# Patient Record
Sex: Female | Born: 1935 | Race: White | Hispanic: No | Marital: Single | Smoking: Former smoker
Health system: Southern US, Community
[De-identification: ages and names within clinical notes are randomized; demographics above are authoritative.]

## PROBLEM LIST (undated history)

## (undated) DIAGNOSIS — F0390 Unspecified dementia without behavioral disturbance: Secondary | ICD-10-CM

## (undated) DIAGNOSIS — N289 Disorder of kidney and ureter, unspecified: Secondary | ICD-10-CM

## (undated) DIAGNOSIS — E785 Hyperlipidemia, unspecified: Secondary | ICD-10-CM

## (undated) DIAGNOSIS — E079 Disorder of thyroid, unspecified: Secondary | ICD-10-CM

## (undated) DIAGNOSIS — I219 Acute myocardial infarction, unspecified: Secondary | ICD-10-CM

## (undated) DIAGNOSIS — I10 Essential (primary) hypertension: Secondary | ICD-10-CM

## (undated) HISTORY — PX: OTHER SURGICAL HISTORY: SHX169

---

## 2013-10-03 ENCOUNTER — Emergency Department: Payer: Self-pay | Admitting: Emergency Medicine

## 2013-10-03 LAB — BASIC METABOLIC PANEL
Anion Gap: 5 — ABNORMAL LOW (ref 7–16)
BUN: 26 mg/dL — ABNORMAL HIGH (ref 7–18)
CALCIUM: 8.3 mg/dL — AB (ref 8.5–10.1)
CO2: 27 mmol/L (ref 21–32)
Chloride: 108 mmol/L — ABNORMAL HIGH (ref 98–107)
Creatinine: 1.26 mg/dL (ref 0.60–1.30)
EGFR (African American): 46 — ABNORMAL LOW
EGFR (Non-African Amer.): 40 — ABNORMAL LOW
Glucose: 96 mg/dL (ref 65–99)
Osmolality: 284 (ref 275–301)
Potassium: 4.6 mmol/L (ref 3.5–5.1)
SODIUM: 140 mmol/L (ref 136–145)

## 2013-10-03 LAB — URINALYSIS, COMPLETE
Bacteria: NONE SEEN
Bilirubin,UR: NEGATIVE
Blood: NEGATIVE
GLUCOSE, UR: NEGATIVE mg/dL (ref 0–75)
KETONE: NEGATIVE
Nitrite: NEGATIVE
PROTEIN: NEGATIVE
Ph: 6 (ref 4.5–8.0)
RBC,UR: 4 /HPF (ref 0–5)
Specific Gravity: 1.026 (ref 1.003–1.030)
Squamous Epithelial: <1
WBC UR: 146 /HPF (ref 0–5)

## 2013-10-03 LAB — CBC
HCT: 36.6 % (ref 35.0–47.0)
HGB: 12.1 g/dL (ref 12.0–16.0)
MCH: 32.9 pg (ref 26.0–34.0)
MCHC: 33 g/dL (ref 32.0–36.0)
MCV: 100 fL (ref 80–100)
Platelet: 218 10*3/uL (ref 150–440)
RBC: 3.68 10*6/uL — AB (ref 3.80–5.20)
RDW: 13.6 % (ref 11.5–14.5)
WBC: 8.3 10*3/uL (ref 3.6–11.0)

## 2013-10-21 ENCOUNTER — Emergency Department: Payer: Self-pay | Admitting: Emergency Medicine

## 2013-10-28 ENCOUNTER — Emergency Department: Payer: Self-pay | Admitting: Emergency Medicine

## 2013-10-28 LAB — COMPREHENSIVE METABOLIC PANEL
ALK PHOS: 59 U/L
ALT: 17 U/L (ref 12–78)
ANION GAP: 7 (ref 7–16)
AST: 32 U/L (ref 15–37)
Albumin: 2.7 g/dL — ABNORMAL LOW (ref 3.4–5.0)
BUN: 21 mg/dL — AB (ref 7–18)
Bilirubin,Total: 0.4 mg/dL (ref 0.2–1.0)
CALCIUM: 9 mg/dL (ref 8.5–10.1)
CHLORIDE: 104 mmol/L (ref 98–107)
CREATININE: 1.04 mg/dL (ref 0.60–1.30)
Co2: 26 mmol/L (ref 21–32)
EGFR (African American): 58 — ABNORMAL LOW
GFR CALC NON AF AMER: 50 — AB
Glucose: 90 mg/dL (ref 65–99)
Osmolality: 276 (ref 275–301)
POTASSIUM: 4.3 mmol/L (ref 3.5–5.1)
Sodium: 137 mmol/L (ref 136–145)
Total Protein: 6.6 g/dL (ref 6.4–8.2)

## 2013-10-28 LAB — CK TOTAL AND CKMB (NOT AT ARMC)
CK, TOTAL: 104 U/L
CK-MB: 3.2 ng/mL (ref 0.5–3.6)

## 2013-10-28 LAB — CBC
HCT: 36 % (ref 35.0–47.0)
HGB: 11.8 g/dL — ABNORMAL LOW (ref 12.0–16.0)
MCH: 31.8 pg (ref 26.0–34.0)
MCHC: 32.7 g/dL (ref 32.0–36.0)
MCV: 97 fL (ref 80–100)
Platelet: 197 10*3/uL (ref 150–440)
RBC: 3.71 10*6/uL — ABNORMAL LOW (ref 3.80–5.20)
RDW: 13.1 % (ref 11.5–14.5)
WBC: 5.8 10*3/uL (ref 3.6–11.0)

## 2013-10-28 LAB — TROPONIN I: TROPONIN-I: 0.03 ng/mL

## 2013-10-28 LAB — PRO B NATRIURETIC PEPTIDE: B-Type Natriuretic Peptide: 1506 pg/mL — ABNORMAL HIGH (ref 0–450)

## 2013-12-21 ENCOUNTER — Emergency Department: Payer: Self-pay | Admitting: Emergency Medicine

## 2013-12-21 ENCOUNTER — Ambulatory Visit: Payer: Self-pay | Admitting: Internal Medicine

## 2014-01-02 ENCOUNTER — Inpatient Hospital Stay: Payer: Self-pay | Admitting: Internal Medicine

## 2014-01-02 LAB — URINALYSIS, COMPLETE
BILIRUBIN, UR: NEGATIVE
Glucose,UR: NEGATIVE mg/dL (ref 0–75)
Ketone: NEGATIVE
Leukocyte Esterase: NEGATIVE
Nitrite: NEGATIVE
PH: 6 (ref 4.5–8.0)
RBC,UR: 250 /HPF (ref 0–5)
SPECIFIC GRAVITY: 1.014 (ref 1.003–1.030)
Squamous Epithelial: NONE SEEN

## 2014-01-02 LAB — CBC
HCT: 34.3 % — ABNORMAL LOW (ref 35.0–47.0)
HGB: 11.5 g/dL — ABNORMAL LOW (ref 12.0–16.0)
MCH: 31.9 pg (ref 26.0–34.0)
MCHC: 33.5 g/dL (ref 32.0–36.0)
MCV: 95 fL (ref 80–100)
PLATELETS: 208 10*3/uL (ref 150–440)
RBC: 3.6 10*6/uL — AB (ref 3.80–5.20)
RDW: 13.5 % (ref 11.5–14.5)
WBC: 8.9 10*3/uL (ref 3.6–11.0)

## 2014-01-02 LAB — BASIC METABOLIC PANEL
Anion Gap: 6 — ABNORMAL LOW (ref 7–16)
BUN: 53 mg/dL — ABNORMAL HIGH (ref 7–18)
Calcium, Total: 9.6 mg/dL (ref 8.5–10.1)
Chloride: 103 mmol/L (ref 98–107)
Co2: 30 mmol/L (ref 21–32)
Creatinine: 2.4 mg/dL — ABNORMAL HIGH (ref 0.60–1.30)
GFR CALC AF AMER: 21 — AB
GFR CALC NON AF AMER: 18 — AB
GLUCOSE: 94 mg/dL (ref 65–99)
Osmolality: 292 (ref 275–301)
Potassium: 4.1 mmol/L (ref 3.5–5.1)
Sodium: 139 mmol/L (ref 136–145)

## 2014-01-03 LAB — BASIC METABOLIC PANEL
ANION GAP: 10 (ref 7–16)
BUN: 40 mg/dL — AB (ref 7–18)
CALCIUM: 8.4 mg/dL — AB (ref 8.5–10.1)
CREATININE: 2.02 mg/dL — AB (ref 0.60–1.30)
Chloride: 107 mmol/L (ref 98–107)
Co2: 25 mmol/L (ref 21–32)
EGFR (African American): 26 — ABNORMAL LOW
EGFR (Non-African Amer.): 23 — ABNORMAL LOW
Glucose: 82 mg/dL (ref 65–99)
Osmolality: 292 (ref 275–301)
POTASSIUM: 3.4 mmol/L — AB (ref 3.5–5.1)
SODIUM: 142 mmol/L (ref 136–145)

## 2014-01-03 LAB — CBC WITH DIFFERENTIAL/PLATELET
BASOS PCT: 0.7 %
Basophil #: 0.1 10*3/uL (ref 0.0–0.1)
Eosinophil #: 0.1 10*3/uL (ref 0.0–0.7)
Eosinophil %: 1.6 %
HCT: 33.9 % — ABNORMAL LOW (ref 35.0–47.0)
HGB: 11.4 g/dL — ABNORMAL LOW (ref 12.0–16.0)
LYMPHS ABS: 0.9 10*3/uL — AB (ref 1.0–3.6)
LYMPHS PCT: 9.2 %
MCH: 31.7 pg (ref 26.0–34.0)
MCHC: 33.6 g/dL (ref 32.0–36.0)
MCV: 95 fL (ref 80–100)
MONO ABS: 0.7 x10 3/mm (ref 0.2–0.9)
Monocyte %: 6.9 %
NEUTROS ABS: 7.7 10*3/uL — AB (ref 1.4–6.5)
Neutrophil %: 81.6 %
Platelet: 213 10*3/uL (ref 150–440)
RBC: 3.59 10*6/uL — ABNORMAL LOW (ref 3.80–5.20)
RDW: 13.7 % (ref 11.5–14.5)
WBC: 9.4 10*3/uL (ref 3.6–11.0)

## 2014-01-03 LAB — TSH: THYROID STIMULATING HORM: 8.46 u[IU]/mL — AB

## 2014-01-04 LAB — BASIC METABOLIC PANEL
Anion Gap: 11 (ref 7–16)
BUN: 37 mg/dL — AB (ref 7–18)
CO2: 21 mmol/L (ref 21–32)
Calcium, Total: 7.9 mg/dL — ABNORMAL LOW (ref 8.5–10.1)
Chloride: 111 mmol/L — ABNORMAL HIGH (ref 98–107)
Creatinine: 1.83 mg/dL — ABNORMAL HIGH (ref 0.60–1.30)
EGFR (Non-African Amer.): 25 — ABNORMAL LOW
GFR CALC AF AMER: 29 — AB
GLUCOSE: 82 mg/dL (ref 65–99)
Osmolality: 293 (ref 275–301)
Potassium: 3.9 mmol/L (ref 3.5–5.1)
SODIUM: 143 mmol/L (ref 136–145)

## 2014-01-05 LAB — BASIC METABOLIC PANEL
Anion Gap: 11 (ref 7–16)
BUN: 32 mg/dL — ABNORMAL HIGH (ref 7–18)
CHLORIDE: 115 mmol/L — AB (ref 98–107)
CREATININE: 1.72 mg/dL — AB (ref 0.60–1.30)
Calcium, Total: 7.3 mg/dL — ABNORMAL LOW (ref 8.5–10.1)
Co2: 20 mmol/L — ABNORMAL LOW (ref 21–32)
EGFR (African American): 32 — ABNORMAL LOW
EGFR (Non-African Amer.): 27 — ABNORMAL LOW
GLUCOSE: 89 mg/dL (ref 65–99)
OSMOLALITY: 297 (ref 275–301)
POTASSIUM: 3.5 mmol/L (ref 3.5–5.1)
Sodium: 146 mmol/L — ABNORMAL HIGH (ref 136–145)

## 2014-01-12 ENCOUNTER — Inpatient Hospital Stay: Payer: Self-pay | Admitting: Internal Medicine

## 2014-01-12 LAB — COMPREHENSIVE METABOLIC PANEL
ALBUMIN: 2.6 g/dL — AB (ref 3.4–5.0)
ALK PHOS: 67 U/L
ALT: 27 U/L
ANION GAP: 8 (ref 7–16)
BUN: 39 mg/dL — AB (ref 7–18)
Bilirubin,Total: 0.3 mg/dL (ref 0.2–1.0)
Calcium, Total: 8.6 mg/dL (ref 8.5–10.1)
Chloride: 107 mmol/L (ref 98–107)
Co2: 24 mmol/L (ref 21–32)
Creatinine: 2.05 mg/dL — ABNORMAL HIGH (ref 0.60–1.30)
GLUCOSE: 92 mg/dL (ref 65–99)
Osmolality: 287 (ref 275–301)
POTASSIUM: 4.1 mmol/L (ref 3.5–5.1)
SGOT(AST): 51 U/L — ABNORMAL HIGH (ref 15–37)
SODIUM: 139 mmol/L (ref 136–145)
Total Protein: 6.6 g/dL (ref 6.4–8.2)

## 2014-01-12 LAB — CBC
HCT: 32.9 % — ABNORMAL LOW (ref 35.0–47.0)
HGB: 11 g/dL — ABNORMAL LOW (ref 12.0–16.0)
MCH: 31.5 pg (ref 26.0–34.0)
MCHC: 33.3 g/dL (ref 32.0–36.0)
MCV: 95 fL (ref 80–100)
Platelet: 226 10*3/uL (ref 150–440)
RBC: 3.48 10*6/uL — ABNORMAL LOW (ref 3.80–5.20)
RDW: 14 % (ref 11.5–14.5)
WBC: 9.6 10*3/uL (ref 3.6–11.0)

## 2014-01-12 LAB — URINALYSIS, COMPLETE
Bilirubin,UR: NEGATIVE
GLUCOSE, UR: NEGATIVE mg/dL (ref 0–75)
Ketone: NEGATIVE
Nitrite: NEGATIVE
PH: 6 (ref 4.5–8.0)
Specific Gravity: 1.01 (ref 1.003–1.030)
Squamous Epithelial: 3

## 2014-01-12 LAB — TROPONIN I: TROPONIN-I: 0.02 ng/mL

## 2014-01-12 LAB — CK TOTAL AND CKMB (NOT AT ARMC)
CK, TOTAL: 427 U/L — AB
CK-MB: 8.5 ng/mL — AB (ref 0.5–3.6)

## 2014-01-12 LAB — PRO B NATRIURETIC PEPTIDE: B-TYPE NATIURETIC PEPTID: 4499 pg/mL — AB (ref 0–450)

## 2014-01-13 LAB — BASIC METABOLIC PANEL
Anion Gap: 9 (ref 7–16)
BUN: 38 mg/dL — ABNORMAL HIGH (ref 7–18)
CO2: 24 mmol/L (ref 21–32)
CREATININE: 2.01 mg/dL — AB (ref 0.60–1.30)
Calcium, Total: 8.2 mg/dL — ABNORMAL LOW (ref 8.5–10.1)
Chloride: 109 mmol/L — ABNORMAL HIGH (ref 98–107)
EGFR (African American): 31 — ABNORMAL LOW
EGFR (Non-African Amer.): 25 — ABNORMAL LOW
Glucose: 99 mg/dL (ref 65–99)
Osmolality: 292 (ref 275–301)
POTASSIUM: 3.5 mmol/L (ref 3.5–5.1)
Sodium: 142 mmol/L (ref 136–145)

## 2014-01-13 LAB — CBC WITH DIFFERENTIAL/PLATELET
BASOS ABS: 0.1 10*3/uL (ref 0.0–0.1)
Basophil %: 0.5 %
Eosinophil #: 0.2 10*3/uL (ref 0.0–0.7)
Eosinophil %: 1.5 %
HCT: 30.3 % — ABNORMAL LOW (ref 35.0–47.0)
HGB: 10.3 g/dL — ABNORMAL LOW (ref 12.0–16.0)
LYMPHS PCT: 9.8 %
Lymphocyte #: 1 10*3/uL (ref 1.0–3.6)
MCH: 31.8 pg (ref 26.0–34.0)
MCHC: 33.8 g/dL (ref 32.0–36.0)
MCV: 94 fL (ref 80–100)
Monocyte #: 0.8 x10 3/mm (ref 0.2–0.9)
Monocyte %: 7.6 %
NEUTROS PCT: 80.6 %
Neutrophil #: 8.2 10*3/uL — ABNORMAL HIGH (ref 1.4–6.5)
Platelet: 223 10*3/uL (ref 150–440)
RBC: 3.22 10*6/uL — ABNORMAL LOW (ref 3.80–5.20)
RDW: 14 % (ref 11.5–14.5)
WBC: 10.2 10*3/uL (ref 3.6–11.0)

## 2014-01-13 LAB — CK: CK, Total: 272 U/L — ABNORMAL HIGH

## 2014-01-14 LAB — CBC WITH DIFFERENTIAL/PLATELET
BASOS ABS: 0 10*3/uL (ref 0.0–0.1)
BASOS PCT: 0.5 %
Eosinophil #: 0.1 10*3/uL (ref 0.0–0.7)
Eosinophil %: 1.8 %
HCT: 28.5 % — ABNORMAL LOW (ref 35.0–47.0)
HGB: 9.6 g/dL — ABNORMAL LOW (ref 12.0–16.0)
LYMPHS ABS: 0.8 10*3/uL — AB (ref 1.0–3.6)
Lymphocyte %: 11.1 %
MCH: 31.8 pg (ref 26.0–34.0)
MCHC: 33.7 g/dL (ref 32.0–36.0)
MCV: 94 fL (ref 80–100)
MONO ABS: 0.5 x10 3/mm (ref 0.2–0.9)
MONOS PCT: 6.6 %
Neutrophil #: 5.9 10*3/uL (ref 1.4–6.5)
Neutrophil %: 80 %
Platelet: 194 10*3/uL (ref 150–440)
RBC: 3.03 10*6/uL — ABNORMAL LOW (ref 3.80–5.20)
RDW: 14.1 % (ref 11.5–14.5)
WBC: 7.4 10*3/uL (ref 3.6–11.0)

## 2014-01-14 LAB — BASIC METABOLIC PANEL
Anion Gap: 7 (ref 7–16)
BUN: 32 mg/dL — ABNORMAL HIGH (ref 7–18)
CHLORIDE: 113 mmol/L — AB (ref 98–107)
Calcium, Total: 7.5 mg/dL — ABNORMAL LOW (ref 8.5–10.1)
Co2: 25 mmol/L (ref 21–32)
Creatinine: 1.62 mg/dL — ABNORMAL HIGH (ref 0.60–1.30)
GFR CALC AF AMER: 39 — AB
GFR CALC NON AF AMER: 32 — AB
Glucose: 96 mg/dL (ref 65–99)
OSMOLALITY: 295 (ref 275–301)
POTASSIUM: 3.4 mmol/L — AB (ref 3.5–5.1)
Sodium: 145 mmol/L (ref 136–145)

## 2014-01-15 LAB — URINE CULTURE

## 2014-01-16 LAB — BASIC METABOLIC PANEL
Anion Gap: 8 (ref 7–16)
BUN: 25 mg/dL — AB (ref 7–18)
CALCIUM: 7.2 mg/dL — AB (ref 8.5–10.1)
CO2: 23 mmol/L (ref 21–32)
Chloride: 113 mmol/L — ABNORMAL HIGH (ref 98–107)
Creatinine: 1.55 mg/dL — ABNORMAL HIGH (ref 0.60–1.30)
GFR CALC AF AMER: 41 — AB
GFR CALC NON AF AMER: 34 — AB
Glucose: 83 mg/dL (ref 65–99)
OSMOLALITY: 291 (ref 275–301)
Potassium: 3.4 mmol/L — ABNORMAL LOW (ref 3.5–5.1)
Sodium: 144 mmol/L (ref 136–145)

## 2014-01-17 LAB — POTASSIUM: Potassium: 3.9 mmol/L (ref 3.5–5.1)

## 2014-01-20 ENCOUNTER — Ambulatory Visit: Payer: Self-pay | Admitting: Internal Medicine

## 2014-04-22 ENCOUNTER — Ambulatory Visit
Admit: 2014-04-22 | Disposition: A | Discharge status: Home / Self Care | Payer: Self-pay | Attending: Nurse Practitioner | Admitting: Nurse Practitioner

## 2014-07-02 ENCOUNTER — Emergency Department: Payer: Self-pay | Admitting: Emergency Medicine

## 2014-08-13 NOTE — Consult Note (Signed)
PATIENT NAME:  Ebony Harper, Ebony MR#:  [id number] DATE OF BIRTH:  Jun 12, 1930  DATE OF CONSULTATION:  01/14/2014  REFERRING PHYSICIAN:   CONSULTING PHYSICIAN:  Laurier NancyShaukat A. Gualberto Wahlen, MD  INDICATION FOR CONSULTATION: Evaluation for pacemaker with prolonged pause on monitor with syncopal episode.   HISTORY OF PRESENT ILLNESS: This is an 81 year old white female with a past medical history of hyperlipidemia, hypothyroidism, dementia, and hypertension who presented to the Emergency Room with apparently TIA type of symptoms. She had an episode where she got confused and kind of passed out. I was asked to evaluate the patient because on the monitor she showed 16 second pause.  PAST MEDICAL HISTORY: Alzheimer's, hypertension, hypothyroidism, hyperlipidemia, depression, UTI and fall.  SOCIAL HISTORY: Unremarkable.   FAMILY HISTORY: Unremarkable.   HOME MEDICATIONS: Norvasc and clonidine.   PHYSICAL EXAMINATION: GENERAL: She is right now sleeping. Apparently was confused earlier. VITAL SIGNS: Her blood pressure is 148/76, respirations 18, pulse right now is 59, and saturation is 92.  NECK: No JVD.  LUNGS: Good air entry. No rales or rhonchi.  HEART: Regular rate and rhythm. Normal S1, S2. No audible murmur.  ABDOMEN: Soft, nontender. Positive bowel sounds.  EXTREMITIES: No pedal edema.   DIAGNOSTIC DATA: EKG shows sinus rhythm, 63 beats per minute, left axis deviation with low voltage, nonspecific ST-T changes.   CT scan was showing no intracranial hemorrhage or trauma.   Labs show her BUN and creatinine were elevated and 42, but now creatinine is 1.62 and BUN is 32.   Her monitor showed 16 second pause with almost basically asystole for 16.6 seconds followed by 3rd degree AV block. The strips are in the chart.   ASSESSMENT AND PLAN: The patient has syncopal episode with asystole for 16.6 seconds followed by 3rd degree AV block with AV dissociation. Discussed putting temporary pacemaker followed by  permanent pacemaker. Order was placed, however, the power of attorney says that she had indicated to her before that she does not want a permanent pacemaker, thus she would not agree to signing a consent, and she also has been a DNR in the past, thus advise observing the patient, hold off on Norvasc and clonidine. Will control the blood pressure with hydralazine, which does not lower the heart rate. Continue IV fluids and also will get an echocardiogram to look at her ejection fraction. Thank you very much for the referral.   ____________________________ Laurier NancyShaukat A. Ebony Rickel, MD sak:sb D: 01/14/2014 08:21:04 ET T: 01/14/2014 08:35:33 ET JOB#: [id number]  cc: Laurier NancyShaukat A. Lyrique Hakim, MD, <Dictator> Laurier NancySHAUKAT A Analeigha Nauman MD ELECTRONICALLY SIGNED 01/27/2014 9:56

## 2014-08-13 NOTE — H&P (Signed)
PATIENT NAME:  Ebony Harper, Kandiss MR#:  [id number] DATE OF BIRTH:  1931-02-10  DATE OF ADMISSION:  01/02/2014  PRIMARY CARE PROVIDER: [facility].  EMERGENCY DEPARTMENT REFERRING DOCTOR: Darien Ramusavid W. Kaminski, MD   CHIEF COMPLAINT: Patient found on the floor by her family members.   HISTORY OF PRESENT ILLNESS: The patient is an 81 year old white female with dementia who currently resides in an Alzheimer's memory unit who was found on the floor by her family member, awake but not able to get up. She was very weak. The patient was brought to the ED and was noted to have a renal function that appears to be getting worse, with her creatinine being 2.40. The patient appears very dehydrated, as well. She is not drinking much fluid but has been eating okay, according to her family members that are at the bedside. She has not had any nausea, vomiting, or diarrhea; has not had any chest pain or shortness of breath. No fevers, chills. The patient otherwise is unable to provide me any history due to her dementia.   PAST MEDICAL HISTORY: 1.  History of dementia.  2.  History of hypertension.  3.  Hypothyroidism.  4.  History of hyperlipidemia.   PAST SURGICAL HISTORY: None.   ALLERGIES: None.   MEDICATIONS: Tylenol 1000 mg 3 times a day as needed; alendronate 70 mg once weekly on Sunday; aspirin 81, 1 tab p.o. q. daily; atorvastatin 40 at bedtime; calcium plus vitamin D 1 tab p.o. b.i.d.; iron sulfate 325, 1 tab p.o. q. daily; Lasix 20, 1 tab p.o. q. daily; levothyroxine 125 mcg q. daily; ramipril 2.5 q. daily; risperidone 1 mg p.o. t.i.d.; sertraline 25, 1 tab p.o. q. daily; tramadol 50 mg p.o. t.i.d. p.r.n.; Nitrostat 0.4 sublingual p.r.n.   SOCIAL HISTORY: Does not smoke. Does not drink. No drugs.   FAMILY HISTORY: Positive for hypertension.  REVIEW OF SYSTEMS: Unobtainable due to patient's dementia.   PHYSICAL EXAMINATION:  VITAL SIGNS: Temperature 97.7, pulse 57, respirations 18, blood  pressure 188/70.  GENERAL: The patient is a well-developed female, appears very dehydrated.  HEENT: Head atraumatic, normocephalic. Pupils equally round, reactive to light and accommodation. There is no conjunctival pallor. No scleral icterus. Extraocular movements intact. Nasal exam shows no drainage or ulceration.  OROPHARYNX: Very dry without any exudate.  NECK: Supple without any JVD. No thyromegaly.  CARDIOVASCULAR: Regular rate and rhythm. No murmurs, rubs, clicks, or gallops.  LUNGS: Clear to auscultation bilaterally without any rales, rhonchi, wheezing.  ABDOMEN: Soft, nontender, nondistended. Positive bowel sounds x 4. No hepatosplenomegaly.  EXTREMITIES: No clubbing, cyanosis, or edema.  SKIN: No rash.  LYMPHATICS: No lymph nodes palpable.  VASCULAR: Good DP, PT pulses.  NEUROLOGICAL: The patient is awake, not oriented to time or place. Cranial nerves II through XII grossly intact.  PSYCHIATRIC: Currently not anxious.  EVALUATIONS: Glucose 94, BUN 53, creatinine 2.40, sodium 139, potassium 4.1, chloride 103, CO2 of 30, calcium 9.6. WBC 8.9, hemoglobin 11.5, platelet count 208,000.   ASSESSMENT AND PLAN: The patient is an 81 year old white female with a history of dementia, hypertension, hyperlipidemia, hypothyroidism, who was found on the floor in the bathroom, noted to have acute renal failure.  1.  Fall, possibly due to dehydration. At this time, we will give her intravenous fluids, follow renal function, stop Lasix. We will obtain physical therapy, occupational therapy evaluation and treatment and try to ambulate the patient.  2.  Hypertension. We will hold ramipril due to acute renal failure, use hydralazine  p.r.n.  3.  Hyperlipidemia. Continue atorvastatin.  4.  Dementia with behavioral disturbances. Continue risperidone and sertraline as taking at home.  5.  Miscellaneous. The patient will be on heparin for deep vein thrombosis prophylaxis.  TIME SPENT ON THIS PATIENT: 50  minutes.   ____________________________ Lacie Scotts Allena Katz, MD shp:ST D: 01/02/2014 21:05:02 ET T: 01/02/2014 22:02:59 ET JOB#: [id number]  cc: Payeton Germani H. Allena Katz, MD, <Dictator> Charise Carwin MD ELECTRONICALLY SIGNED 01/07/2014 8:41

## 2014-08-13 NOTE — Consult Note (Signed)
PATIENT NAME:  [name], [name] MR#:  [id number] DATE OF BIRTH:  11-Oct-1930  DATE OF CONSULTATION:  01/16/2014  CONSULTING PHYSICIAN:  Titianna Loomis K. Jae Skeet, MD  SUBJECTIVE:  The patient was seen in consultation in [facility] number [facility].  The patient is an 81 year old white female who is a poor historian.  According to the information obtained from the staff, the patient has long history of Alzheimer's dementia and has [facility].  The patient was admitted for UTI and became combative and aggressive in her behavior.  Staff reports that they cannot redirect the patient who is very agitated and she is hallucinating and seeing elephants around and not aware of her surroundings.    MENTAL STATUS EXAMINATION:  The patient was seen lying in bed.  She was sleepy, but arousable and she became alert and she knew her full name.  She did not know where she was and she did not what the day was, what the time was and what the place was and in fact when questions were asked she replied stating "you are playing games."  She cannot identify any of the objects around her.  She could not identify a pen.  She could not identify a piece of paper.  She could not identify a television.  She was completely agitated and irritable.  She starting hoarding the hand of the staff nurse and would not leave it and became "very angry and aggressive."  Insight and judgment impaired.  Impulse control is poor.  Cognition is impaired.  Is psychotic and probably responding to stimuli and with acting out, behavior which is not redirectable.    IMPRESSION:  Alzheimer dementia, severe.  RECOMMENDATIONS:  Start the patient on Haldol 1 mg p.o. 3 times a day, Ativan 1 mg p.o. twice a day.  Ativan 1 mg p.o. or intramuscular every 4 hours p.r.n. for agitation, Haldol 1 mg p.o. or intramuscular every 4 hours p.r.n. for agitation and can monitor for side effects, Aricept 5 mg p.o. daily, Namenda 10 mg p.o. daily.  If the patient is treated  for urinary tract infection and is stabilized medically, she can be [facility] as it is unlikely she is going to get much with her dementia and with her cognition, she has to be treated for her symptoms as stated above, until she calms down and is redirectable and this can be done at memory care unit.      ____________________________ Ebony MantisSurya K. Guss Bundehalla, MD skc:DT D: 01/16/2014 10:34:00 ET T: 01/16/2014 14:06:18 ET JOB#: [id number]  cc: Monika SalkSurya K. Guss Bundehalla, MD, <Dictator> Beau FannySURYA K Nichol Ator MD ELECTRONICALLY SIGNED 02/04/2014 14:23

## 2014-08-13 NOTE — H&P (Signed)
PATIENT NAME:  Ebony Harper, Ebony Harper MR#:  [id number] DATE OF BIRTH:  1930/06/09  DATE OF ADMISSION:  01/12/2014  PRIMARY CARE PROVIDER: [facility].    REFERRING DOCTOR:   Dr. Fanny BienQuale.     CHIEF COMPLAINT: Fall.    HISTORY OF PRESENT ILLNESS: The patient is an 81 year old white female with history of advanced dementia who was actually hospitalized from September 13 to September 16, at that time she was admitted with a fall, acute renal failure. The patient was in the hospital for 3 days and subsequently [facility], who again today was noted to be found on the floor. The patient was brought to the Emergency Department. The patient is noted to have significant UTI with 3 + leukocytes and a WBC of 705. The patient otherwise has dementia and is unable to tell me any further history.   PAST MEDICAL HISTORY:  1.  Dementia.  2.  Hypertension.  3.  Hypothyroidism.    4.  Hyperlipidemia.   PAST SURGICAL HISTORY: None.   ALLERGIES: None.   MEDICATIONS: She is on tramadol 50 half tab p.o. t.i.d., sertraline 25 daily, Salonpas pain patch applied to affected area of left shoulder daily, Risperdal 1 mg t.i.d., Nitrostat 0.4 p.r.n., levothyroxine 125 mcg daily, iron sulfate 325 mg 1 tab p.o. daily, clonidine 0.1 one tab p.o. b.i.d., calcium plus vitamin D 1 tab p.o. b.i.d., atorvastatin 40 at bedtime, aspirin 81 one tab p.o. daily, amlodipine 10 daily, alendronate 70 mg weekly, acetaminophen 1000 mg t.i.d.    SOCIAL HISTORY: Does not smoke. Does not drink. No drugs.   FAMILY HISTORY: Positive for hypertension.   REVIEW OF SYSTEMS: Unobtainable due to patient's dementia.   PHYSICAL EXAMINATION:   VITAL SIGNS: Temperature 97.9, pulse 56, respirations 18, blood pressure 177/67, O2 92%.  GENERAL: The patient is a well-developed, well-nourished female in no acute distress.  HEENT: Head atraumatic, normocephalic. Pupils equally round, reactive to light and  accommodation. There is no conjunctival pallor. No scleral icterus. Nasal exam shows no drainage or ulceration. External ear exam shows no erythema or drainage. Oropharynx is a little  dry without any exudate.  NECK: Supple without any JVD.  No thyromegaly.  CARDIOVASCULAR: Regular rate and rhythm. No murmurs, rubs, clicks, or gallops.  LUNGS: Clear to auscultation bilaterally without any rales, rhonchi, wheezing. No accessory muscle usage.  ABDOMEN: Soft, nontender, nondistended. Positive bowel sounds x 4. No hepatosplenomegaly.  EXTREMITIES: She has got 1+ edema. No cyanosis, no clubbing.  SKIN: No rash.  LYMPH NODES: Nonpalpable.  VASCULAR: Good DP and PT pulses.  NEUROLOGIC: The patient is awake, not oriented to place or time. Cranial nerves II-XII grossly intact.  PSYCHIATRIC: Not anxious or depressed.   EVALUATION IN THE EMERGENCY DEPARTMENT:   Urinalysis 2 + blood, 3 + leukocytes, WBCs 705. WBC 9.6, hemoglobin 11, platelet count 226,000. CPK of 427, CK-MB (Dictation Anomaly)  Troponin 0.02. LFTs, albumin 2.6, AST is 51. Glucose 92, BUN 39, creatinine 2.05, sodium 139, potassium 4.1, chloride 107, CO2 of 24.   ASSESSMENT AND PLAN: The patient is an 81 year old white female with history of dementia found on the floor with a fall.   1.  Fall possibly due to urinary tract infection and gait instability. At this time we will treat her urinary tract infection, get PT and OT evaluation and treat.  2.  Urinary tract infection. We will treat with IV Rocephin, get urine cultures. Oral antibiotics based on her urine  cultures.  3.  Acute renal failure on chronic renal failure. Will give her IV fluids, monitor her renal function.  4.  Hyperlipidemia. Continue atorvastatin.  5.  Hypertension. Continue amlodipine and clonidine as taking at home.  6.  Hyperlipidemia. Continue atorvastatin.  7.  Miscellaneous. The patient will be on heparin for DVT prophylaxis.   CODE STATUS: DNR as previously.    TIME SPENT ON THIS PATIENT: 50 minutes.     ____________________________ Ebony Scotts Allena Katz, MD shp:bu D: 01/12/2014 20:22:41 ET T: 01/12/2014 20:57:56 ET JOB#: [id number]  cc: Westly Hinnant H. Allena Katz, MD, <Dictator> Charise Carwin MD ELECTRONICALLY SIGNED 01/21/2014 17:58

## 2014-08-13 NOTE — Discharge Summary (Signed)
PATIENT NAME:  Ebony Harper, Claudell MR#:  [id number] DATE OF BIRTH:  [birth year]  DATE OF ADMISSION:  01/02/2014 DATE OF DISCHARGE:  01/05/2014  For a detailed note, please see the history and physical done by Dr. Auburn BilberryShreyang Patel.   DIAGNOSES AT DISCHARGE IS AS FOLLOWS:  Acute renal failure, dementia with agitation, hypothyroidism, hypertension, hyperlipidemia.   DIET:  The patient is being discharged on a low-sodium diet.   ACTIVITY: As tolerated.   FOLLOWUP:  Follow-up with her primary care physician at the assisted living.   DISCHARGE MEDICATIONS: Tylenol 500 mg 2 tabs t.i.d., Fosamax 70 mg weekly, aspirin 81 mg daily, atorvastatin 40 mg at bedtime, calcium with vitamin D 1 tab b.i.d., iron sulfate 325 mg daily, Risperdal 1 mg t.i.d., Zoloft 25 mg daily, tramadol 50 mg 1/2 tab t.i.d. as needed, Synthroid 125 mcg daily, sublingual nitroglycerin as needed, clonidine 0.1 mg b.i.d., and amlodipine 10 mg daily.   CONSULTANTS DURING THE HOSPITAL COURSE: Dr. Harriett SineNancy Phifer, palliative care.   PERTINENT STUDIES DONE DURING THE HOSPITAL COURSE: An x-ray of the left shoulder done showing no acute findings, chronic fracture involving the proximal left humerus.   HOSPITAL COURSE: This is an 81 year old female, who presented to the hospital with generalized weakness and being found on the floor noted to be in acute renal failure.  1.  Acute renal failure. This was likely prerenal in nature and related to dehydration. The patient was found on the floor. Has not been eating and drinking well given her dementia. She was started on aggressive IV fluids. Her creatinine 2.2; it has come down to as low as 1.7. She is more alert, awake, and tolerating p.o. well and therefore is being discharged back to her skilled nursing facility. The patient was on an ACE inhibitor, which I am discontinuing at this point.  2.  Hypertension. The patient's blood pressures were somewhat labile in the hospital. She was taken off the lisinopril  given her acute renal failure; therefore, I started her on some clonidine and Norvasc, which seemed to have controlled her blood pressure fairly well, and she will be discharged on that presently.  3.  Hypothyroidism. The patient was maintained on her Synthroid. She will resume that.  4.  Hyperlipidemia.  She was maintained on atorvastatin. She will resume that.  5. Dementia with agitation. The patient was maintained on Risperdal and Zoloft. She will continue that. She did have some mild sundowning for which she required some pulse doses of Haldol. Palliative care consult was obtained to goals of care as the patient was a full code prior to coming in, but she is a DO NOT RESUSCITATE now prior to being discharge.   TIME SPENT: Is 40 minutes   ____________________________ Rolly PancakeVivek J. Cherlynn KaiserSainani, MD vjs:lr D: 01/05/2014 15:34:17 ET T: 01/05/2014 16:08:15 ET JOB#: [id number]  cc: Rolly PancakeVivek J. Cherlynn KaiserSainani, MD, <Dictator> Houston SirenVIVEK J Leiam Hopwood MD ELECTRONICALLY SIGNED 01/10/2014 9:43

## 2014-08-13 NOTE — Discharge Summary (Signed)
PATIENT NAME:  Ebony Harper, Ebony Harper MR#:  [id number] DATE OF BIRTH:  1931-03-23  DATE OF ADMISSION:  01/12/2014 DATE OF DISCHARGE:  01/17/2014  CONSULTATIONS: Dr. Welton FlakesKhan, cardiology, and psychiatry.   PROCEDURES: None.   ADMITTING DIAGNOSES: 1.  Fall, possibly due to a urinary tract infection.  2.  Hypertension.  3.  Hyperlipidemia.  4.  Dementia.   DISCHARGE DIAGNOSES: 1.  Altered mental status and fall probably from acute cystitis.  2.  Sinus bradycardia, asymptomatic. The patient and her power of attorney refused pacemaker placement. Clonidine is discontinued.  3.  Hypertension. Continue amlodipine, and hydralazine is added as clonidine is discontinued.  4.  Hyperlipidemia.  5.  Acute psychosis with worsening of dementia, seen by psychiatry.   PROCEDURES: None.   BRIEF HISTORY OF PRESENT ILLNESS AND HOSPITAL COURSE:  1.  The patient is an 81 year old female with a chronic history of dementia, is brought into the ED from [facility] after she sustained a fall. Please review history and physical dictated by Dr. Allena KatzPatel at the time of admission. The patient was admitted to the hospital with a UTI. The fall and altered mental status was assumed to be from a UTI. The patient was started on IV antibiotics. The patient was afebrile and no leukocytosis was noted during the entire hospital course. Initial urine culture, collected on September 23rd, has revealed more than 100,000 colonies of Escherichia coli, sensitive to ceftriaxone. The patient was continued on Rocephin. Repeated culture has revealed 1000 colony-forming units of gram-negative rods on September 25th. With IV Rocephin, condition is improved.   2.  The patient was diagnosed with asymptomatic bradycardia, regarding which a cardiology consult is placed to Dr. Welton FlakesKhan. He discussed with the patient's power of attorney, her close friend, who refused any kind of procedures. The patient's clonidine was discontinued and heart rate  significantly improved.  3.  Hypertension. Blood pressure is elevated because, which was assumed to be rebound hypertension from discontinuing clonidine. The plan is to continue her home medication, amlodipine, and hydralazine is added. The hydralazine needs to be up-titrated as needed basis.  4.  The patient was becoming psychotic during nighttime regarding which a psychiatric consult was placed. Haldol, Ativan is added to the regimen. Also, the patient is started on Aricept and Namenda by psychiatry. The patient's clinical condition significantly improved and the decision is made to transfer the patient to back to [facility]. As per my discussion with the patient's POA, the patient is at her baseline.  5.  The patient has acute kidney injury. IV fluids were given. Creatinine at 2.05 is significantly improved and her creatinine yesterday is at 1.55,  which is close to her baseline.     LABORATORY DATA AND IMAGING STUDIES: Urine culture on September 23rd has revealed greater than 100,000 colonies of Escherichia coli, sensitive to nitrofurantion and ceftriaxone; resistant to ciprofloxacin. White count 7.4, hemoglobin 9.6, hematocrit 28.5 on the 25th of September.  Potassium is 3.9 on September 28th.  The patient's creatinine is at 1.55, BUN 25, potassium 3.4, calcium 7.2.     ACTIVITY: As tolerated.   DIET: Low-sodium, Ensure Plus 3 times a day with meals.   FOLLOWUP:  With primary care physician in a week and Dr. Welton FlakesKhan in a week.   MEDICATIONS AT THE TIME OF DISCHARGE:  Cefdinir 300 mg 1 capsule p.o. q. 12 hours for 3 days, Ensure 3 times a day, hydralazine 25 mg p.o. q. 6 hours, Namenda  10 mg p.o. once daily, Aricept 75 mg p.o. at bedtime, lorazepam 1 mg p.o. every 4 hours as needed for agitation, lorazepam 1 mg p.o. 2 times, Haldol 1 mg p.o. 3 times a day, Salonpas pain patch applied to the left shoulder, levothyroxine 125 mcg p.o. once daily, tramadol 50 mg take 1/2 tablet 3 times a  day, Nitrostat 0.4 mg sublingually every 5 minutes as needed, Zoloft 20 mg p.o. once daily, risperidone 1 mg p.o. 3 times a day, iron sulfate 325 mg p.o. once a day, calcium carbonate 1 tablet p.o. b.i.d., atorvastatin 40 mg once daily, aspirin 81 mg once daily, alendronate 70 mg once a week on Sunday in the morning, Tylenol 500 mg 2 tablets p.o. 3 times a day.   Plan of care was discussed with the patient's medical power of attorney.   TOTAL TIME SPENT:  Is 40 minutes.     ____________________________ Ramonita Lab, MD ag:lr D: 01/17/2014 13:32:24 ET T: 01/17/2014 13:50:41 ET JOB#: [id number]  cc: Ramonita Lab, MD, <Dictator> Ramonita Lab MD ELECTRONICALLY SIGNED 01/21/2014 11:16

## 2014-08-17 ENCOUNTER — Ambulatory Visit
Admit: 2014-08-17 | Disposition: A | Discharge status: Home / Self Care | Payer: Self-pay | Attending: Family Medicine | Admitting: Family Medicine

## 2014-10-13 ENCOUNTER — Other Ambulatory Visit: Payer: Self-pay

## 2014-10-13 ENCOUNTER — Emergency Department: Payer: Medicare Other

## 2014-10-13 ENCOUNTER — Encounter: Payer: Self-pay | Admitting: Intensive Care

## 2014-10-13 ENCOUNTER — Emergency Department
Admission: EM | Admit: 2014-10-13 | Discharge: 2014-10-13 | Disposition: A | Discharge status: Home / Self Care | Payer: Medicare Other | Attending: Emergency Medicine | Admitting: Emergency Medicine

## 2014-10-13 DIAGNOSIS — S0101XA Laceration without foreign body of scalp, initial encounter: Secondary | ICD-10-CM | POA: Diagnosis not present

## 2014-10-13 DIAGNOSIS — W1839XA Other fall on same level, initial encounter: Secondary | ICD-10-CM | POA: Diagnosis not present

## 2014-10-13 DIAGNOSIS — S199XXA Unspecified injury of neck, initial encounter: Secondary | ICD-10-CM | POA: Insufficient documentation

## 2014-10-13 DIAGNOSIS — S0990XA Unspecified injury of head, initial encounter: Secondary | ICD-10-CM | POA: Insufficient documentation

## 2014-10-13 DIAGNOSIS — S3992XA Unspecified injury of lower back, initial encounter: Secondary | ICD-10-CM | POA: Insufficient documentation

## 2014-10-13 DIAGNOSIS — S4992XA Unspecified injury of left shoulder and upper arm, initial encounter: Secondary | ICD-10-CM | POA: Diagnosis not present

## 2014-10-13 DIAGNOSIS — Y998 Other external cause status: Secondary | ICD-10-CM | POA: Diagnosis not present

## 2014-10-13 DIAGNOSIS — G8929 Other chronic pain: Secondary | ICD-10-CM | POA: Insufficient documentation

## 2014-10-13 DIAGNOSIS — Y9389 Activity, other specified: Secondary | ICD-10-CM | POA: Insufficient documentation

## 2014-10-13 DIAGNOSIS — Y92129 Unspecified place in nursing home as the place of occurrence of the external cause: Secondary | ICD-10-CM | POA: Diagnosis not present

## 2014-10-13 DIAGNOSIS — W19XXXA Unspecified fall, initial encounter: Secondary | ICD-10-CM

## 2014-10-13 DIAGNOSIS — F0390 Unspecified dementia without behavioral disturbance: Secondary | ICD-10-CM | POA: Insufficient documentation

## 2014-10-13 HISTORY — DX: Acute myocardial infarction, unspecified: I21.9

## 2014-10-13 HISTORY — DX: Unspecified dementia, unspecified severity, without behavioral disturbance, psychotic disturbance, mood disturbance, and anxiety: F03.90

## 2014-10-13 HISTORY — DX: Disorder of kidney and ureter, unspecified: N28.9

## 2014-10-13 MED ORDER — OXYCODONE-ACETAMINOPHEN 5-325 MG PO TABS
2.0000 | ORAL_TABLET | Freq: Once | ORAL | Status: AC
  Administered 2014-10-13: 2 via ORAL

## 2014-10-13 MED ORDER — OXYCODONE-ACETAMINOPHEN 5-325 MG PO TABS
ORAL_TABLET | ORAL | Status: AC
  Administered 2014-10-13: 2 via ORAL
  Filled 2014-10-13: qty 2

## 2014-10-13 NOTE — Discharge Instructions (Signed)

## 2014-10-13 NOTE — ED Notes (Signed)
Pt arrived by EMS from [facility]. Pt had a fall, unwitnessed and staff not sure of what caused pt to fall. Pt has small laceration to back of head. Pt c/o back, head, and L shoulder pain. Pt has HX of dementia. Pt is oriented to name and birthday.
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
## 2014-10-18 ENCOUNTER — Encounter: Payer: Self-pay | Admitting: Urgent Care

## 2014-10-18 ENCOUNTER — Emergency Department: Payer: Medicare Other

## 2014-10-18 ENCOUNTER — Emergency Department
Admission: EM | Admit: 2014-10-18 | Discharge: 2014-10-18 | Disposition: A | Discharge status: Home / Self Care | Payer: Medicare Other | Attending: Emergency Medicine | Admitting: Emergency Medicine

## 2014-10-18 DIAGNOSIS — T148 Other injury of unspecified body region: Secondary | ICD-10-CM | POA: Diagnosis not present

## 2014-10-18 DIAGNOSIS — Z720 Tobacco use: Secondary | ICD-10-CM | POA: Diagnosis not present

## 2014-10-18 DIAGNOSIS — W1839XA Other fall on same level, initial encounter: Secondary | ICD-10-CM | POA: Diagnosis not present

## 2014-10-18 DIAGNOSIS — Y9389 Activity, other specified: Secondary | ICD-10-CM | POA: Diagnosis not present

## 2014-10-18 DIAGNOSIS — S3992XA Unspecified injury of lower back, initial encounter: Secondary | ICD-10-CM | POA: Insufficient documentation

## 2014-10-18 DIAGNOSIS — Z7982 Long term (current) use of aspirin: Secondary | ICD-10-CM | POA: Diagnosis not present

## 2014-10-18 DIAGNOSIS — W19XXXA Unspecified fall, initial encounter: Secondary | ICD-10-CM

## 2014-10-18 DIAGNOSIS — Y92128 Other place in nursing home as the place of occurrence of the external cause: Secondary | ICD-10-CM | POA: Diagnosis not present

## 2014-10-18 DIAGNOSIS — S79911A Unspecified injury of right hip, initial encounter: Secondary | ICD-10-CM | POA: Diagnosis not present

## 2014-10-18 DIAGNOSIS — Z791 Long term (current) use of non-steroidal anti-inflammatories (NSAID): Secondary | ICD-10-CM | POA: Insufficient documentation

## 2014-10-18 DIAGNOSIS — I10 Essential (primary) hypertension: Secondary | ICD-10-CM | POA: Insufficient documentation

## 2014-10-18 DIAGNOSIS — T07XXXA Unspecified multiple injuries, initial encounter: Secondary | ICD-10-CM

## 2014-10-18 DIAGNOSIS — Z79899 Other long term (current) drug therapy: Secondary | ICD-10-CM | POA: Insufficient documentation

## 2014-10-18 DIAGNOSIS — S4992XA Unspecified injury of left shoulder and upper arm, initial encounter: Secondary | ICD-10-CM | POA: Diagnosis present

## 2014-10-18 DIAGNOSIS — Y998 Other external cause status: Secondary | ICD-10-CM | POA: Insufficient documentation

## 2014-10-18 DIAGNOSIS — R52 Pain, unspecified: Secondary | ICD-10-CM

## 2014-10-18 DIAGNOSIS — F0390 Unspecified dementia without behavioral disturbance: Secondary | ICD-10-CM | POA: Insufficient documentation

## 2014-10-18 HISTORY — DX: Hyperlipidemia, unspecified: E78.5

## 2014-10-18 HISTORY — DX: Disorder of thyroid, unspecified: E07.9

## 2014-10-18 HISTORY — DX: Essential (primary) hypertension: I10

## 2014-10-18 LAB — CBC
HEMATOCRIT: 31.6 % — AB (ref 35.0–47.0)
HEMOGLOBIN: 10.2 g/dL — AB (ref 12.0–16.0)
MCH: 31.4 pg (ref 26.0–34.0)
MCHC: 32.3 g/dL (ref 32.0–36.0)
MCV: 97 fL (ref 80.0–100.0)
Platelets: 241 10*3/uL (ref 150–440)
RBC: 3.26 MIL/uL — AB (ref 3.80–5.20)
RDW: 14.4 % (ref 11.5–14.5)
WBC: 13 10*3/uL — ABNORMAL HIGH (ref 3.6–11.0)

## 2014-10-18 LAB — COMPREHENSIVE METABOLIC PANEL
ALK PHOS: 61 U/L (ref 38–126)
ALT: 17 U/L (ref 14–54)
AST: 23 U/L (ref 15–41)
Albumin: 2.8 g/dL — ABNORMAL LOW (ref 3.5–5.0)
Anion gap: 8 (ref 5–15)
BUN: 39 mg/dL — ABNORMAL HIGH (ref 6–20)
CO2: 25 mmol/L (ref 22–32)
Calcium: 8.4 mg/dL — ABNORMAL LOW (ref 8.9–10.3)
Chloride: 106 mmol/L (ref 101–111)
Creatinine, Ser: 1.21 mg/dL — ABNORMAL HIGH (ref 0.44–1.00)
GFR calc non Af Amer: 41 mL/min — ABNORMAL LOW (ref >60)
GFR, EST AFRICAN AMERICAN: 47 mL/min — AB (ref >60)
Glucose, Bld: 109 mg/dL — ABNORMAL HIGH (ref 65–99)
POTASSIUM: 3.9 mmol/L (ref 3.5–5.1)
SODIUM: 139 mmol/L (ref 135–145)
Total Bilirubin: 0.4 mg/dL (ref 0.3–1.2)
Total Protein: 6.2 g/dL — ABNORMAL LOW (ref 6.5–8.1)

## 2014-10-18 MED ORDER — TRAMADOL HCL 50 MG PO TABS
50.0000 mg | ORAL_TABLET | Freq: Once | ORAL | Status: AC
  Administered 2014-10-18: 50 mg via ORAL

## 2014-10-18 MED ORDER — TRAMADOL HCL 50 MG PO TABS
ORAL_TABLET | ORAL | Status: AC
  Administered 2014-10-18: 50 mg via ORAL
  Filled 2014-10-18: qty 1

## 2014-10-18 MED ORDER — TRAMADOL HCL 50 MG PO TABS: 50.0000 mg | ORAL_TABLET | Freq: Four times a day (QID) | ORAL | Status: DC | PRN

## 2014-10-18 NOTE — ED Notes (Signed)
Patient picked up by EMS - did not speak with this RN nor take packet. EMS was asked by secretary and charge nurse to wait - patient was loaded and taken from the ED. [employer] called by [employer] operational officersecretary and advised that truck would need to return due to them leaving her discharge papers, DNR, and prescriptions. Charge nurse made aware.

## 2014-10-18 NOTE — ED Notes (Signed)
[facility]. Patient reports that she "lost her balance" and fell tonight when coming from the bathroom. Patient with c/o BUE and RLE pain pain. No evidence of deformity noted to any of the aforementioned sites. Patient demented per baseline.

## 2014-10-18 NOTE — ED Notes (Signed)
[place], EMT-P presents to ED for paperwork.

## 2014-10-18 NOTE — ED Notes (Signed)
Had to enter an 0530 pain scale even though patient left at 0500. Scale was based off of 0445 scale done by this RN. Patient unable to be removed from the status board because she was moved OTF at [phone].

## 2014-10-18 NOTE — ED Provider Notes (Signed)
[facility] Emergency Department Provider Note ____________________________________________  Time seen: Approximately 12:17 AM  I have reviewed the triage vital signs and the nursing notes.   HISTORY  Chief Complaint Fall; Shoulder Pain; and Hip Pain   HPI Ebony Harper is a 81 y.o. female who has dementia and fell at the care facility. Patient was primarily complaining of pain all over when she got to the ER. She does not think she hit her head but she did not even remember that she come to the ER for fall. Patient is primarily complaining of pain in her left shoulder right hip lower back. Patient denies any numbness tingling or weakness of extremities. There are no lacerations or abrasions. Patient has mild dementia and is otherwise a poor historian.   Past Medical History  Diagnosis Date  . Dementia   . MI (myocardial infarction)   . Renal disorder   . Thyroid disease   . Hyperlipidemia   . Hypertension     There are no active problems to display for this patient.   No past surgical history on file.  Current Outpatient Rx  Name  Route  Sig  Dispense  Refill  . acetaminophen (TYLENOL) 500 MG tablet   Oral   Take 1,000 mg by mouth 3 (three) times daily.         Marland Kitchen amLODipine (NORVASC) 10 MG tablet   Oral   Take 10 mg by mouth daily.         Marland Kitchen aspirin 81 MG chewable tablet   Oral   Chew 81 mg by mouth daily.         Marland Kitchen atorvastatin (LIPITOR) 40 MG tablet   Oral   Take 40 mg by mouth at bedtime.         . benzonatate (TESSALON) 100 MG capsule   Oral   Take 100 mg by mouth every 8 (eight) hours as needed for cough.         . Calcium Carbonate-Vitamin D (CALCIUM 600-D) 600-400 MG-UNIT per tablet   Oral   Take 1 tablet by mouth 2 (two) times daily.         . diclofenac sodium (VOLTAREN) 1 % GEL   Topical   Apply 2 g topically 3 (three) times daily.         Marland Kitchen docusate sodium (COLACE) 100 MG capsule   Oral   Take 100 mg by  mouth 2 (two) times daily.         . ferrous sulfate 325 (65 FE) MG tablet   Oral   Take 325 mg by mouth daily.         Marland Kitchen levothyroxine (SYNTHROID, LEVOTHROID) 137 MCG tablet   Oral   Take 137 mcg by mouth daily.         Marland Kitchen LORazepam (ATIVAN) 0.5 MG tablet   Oral   Take 0.5 mg by mouth every 8 (eight) hours as needed (agitation).         . nitroGLYCERIN (NITROSTAT) 0.4 MG SL tablet   Sublingual   Place 0.4 mg under the tongue every 5 (five) minutes as needed for chest pain.         Marland Kitchen risperiDONE (RISPERDAL) 1 MG tablet   Oral   Take 1 mg by mouth 3 (three) times daily.         Marland Kitchen senna-docusate (SENOKOT-S) 8.6-50 MG per tablet   Oral   Take 1 tablet by mouth at bedtime.         Marland Kitchen  sertraline (ZOLOFT) 25 MG tablet   Oral   Take 25 mg by mouth daily.         . traMADol (ULTRAM) 50 MG tablet   Oral   Take 25 mg by mouth 2 (two) times daily.         . traMADol (ULTRAM) 50 MG tablet   Oral   Take 1 tablet (50 mg total) by mouth every 6 (six) hours as needed.   20 tablet   0     Allergies Strawberry  No family history on file.  Social History History  Substance Use Topics  . Smoking status: Smoker, Current Status Unknown    Types: Cigarettes  . Smokeless tobacco: Never Used  . Alcohol Use: No    Review of Systems Constitutional: No fever/chills HEENT: Patient denies headache, dizziness, or obvious trauma to the head. Patient also denies neck pain.  Eyes: No visual changes. ENT: No sore throat. Cardiovascular: Denies chest pain. Respiratory: Denies shortness of breath. Gastrointestinal: No abdominal pain.  No nausea, no vomiting.  No diarrhea.  No constipation. Genitourinary: Negative for dysuria. Musculoskeletal: Patient complained of pain in her lower back, left shoulder, right hip. Skin: Negative for rash. Neurological: Negative for headaches, focal weakness or numbness. 10-point ROS otherwise  negative.  ____________________________________________   PHYSICAL EXAM:  VITAL SIGNS: ED Triage Vitals  Enc Vitals Group     BP 10/18/14 0015 170/65 mmHg     Pulse Rate 10/18/14 0015 70     Resp 10/18/14 0015 18     Temp 10/18/14 0015 97.5 F (36.4 C)     Temp Source 10/18/14 0015 Oral     SpO2 10/18/14 0018 95 %     Weight 10/18/14 0015 150 lb (68.04 kg)     Height 10/18/14 0015  (1.6 m)     Head Cir --      Peak Flow --      Pain Score --      Pain Loc --      Pain Edu? --      Excl. in GC? --     Constitutional: Alert and oriented. Well appearing and in no acute distress. Eyes: Conjunctivae are normal. PERRL. EOMI. Head: Atraumatic. Patient is nontender to the cervical spine. There is no evidence of hematomas or lacerations to the scalp. Nose: No congestion/rhinnorhea. Mouth/Throat: Mucous membranes are moist.  Oropharynx non-erythematous. Neck: No stridor.   Cardiovascular: Normal rate, regular rhythm. Grossly normal heart sounds.  Good peripheral circulation. Respiratory: Normal respiratory effort.  No retractions. Lungs CTAB. Gastrointestinal: Soft and nontender. No distention. No abdominal bruits. No CVA tenderness. Musculoskeletal: Patient is having mild tenderness to the proximal left humeral head with limited range of motion secondary to pain. Patient also has some mild tenderness palpation to the right posterior mid buttock and right lateral hip. With mild limited range of motion secondary to pain. The patient has mild tenderness to her lower lumbar area in her back. There is no significant areas of deformity..  No joint effusions. Neurologic:  Normal speech and language. No gross focal neurologic deficits are appreciated. Speech is normal. No gait instability. Skin:  Skin is warm, dry and intact. No rash noted.No bruises noted, no lacerations, no abrasions.  Psychiatric: Mood and affect are normal. Speech and behavior are  normal.  ____________________________________________   LABS (all labs ordered are listed, but only abnormal results are displayed)  Labs Reviewed  CBC - Abnormal; Notable for the following:  WBC 13.0 (*)    RBC 3.26 (*)    Hemoglobin 10.2 (*)    HCT 31.6 (*)    All other components within normal limits  COMPREHENSIVE METABOLIC PANEL - Abnormal; Notable for the following:    Glucose, Bld 109 (*)    BUN 39 (*)    Creatinine, Ser 1.21 (*)    Calcium 8.4 (*)    Total Protein 6.2 (*)    Albumin 2.8 (*)    GFR calc non Af Amer 41 (*)    GFR calc Af Amer 47 (*)    All other components within normal limits   ____________________________________________  EKG  None  ____________________________________________  RADIOLOGY  Dg Chest 2 View  10/18/2014   CLINICAL DATA:  Fall. Patient reports losing her balance, fall coming from the bathroom.  EXAM: CHEST  2 VIEW  COMPARISON:  Chest radiograph 01/12/2014  FINDINGS: The heart is enlarged, there is atherosclerosis of the thoracic aorta. Probable retrocardiac hiatal hernia. Questionable left pleural effusion versus hiatal hernia. Lungs are hyperinflated with increased bronchial markings. There is vague increased density in the left upper lung. No pneumothorax. Chronic deformity of the left proximal humerus. No evidence of displaced rib fracture.  IMPRESSION: 1. Cardiomegaly. Increased density posteriorly on the lateral view may be related to left pleural effusion or hiatal hernia. 2. Vague increased density in the left upper lung. This may reflect superimposed structures, however underlying consolidation or focal lesion or not excluded. Consider short interval follow-up versus chest CT for further characterization.   Electronically Signed   By: Rubye Oaks M.D.   On: 10/18/2014 03:07   Dg Cervical Spine 2 Or 3 Views  10/13/2014   CLINICAL DATA:  Unwitnessed fall.  EXAM: CERVICAL SPINE - 2-3 VIEW  COMPARISON:  CT of the cervical spine  07/02/2014  FINDINGS: Incomplete study as there is non lateral visualization of C6 and C7.  No visible fracture or dislocation.  Mild anterolisthesis at C2-3 and C3-4 is chronic and degenerative based on previous imaging and advanced facet arthropathy. There is advanced degenerative disc narrowing in the lower cervical spine, especially at C5-6. No prevertebral swelling.  Left upper lobe nodule noted on the previous study is not visualized. CT has been recommended previously.  IMPRESSION: 1. Incomplete evaluation as C6 and C7 are obscured laterally. 2. Otherwise, no evidence of cervical spine injury. 3. Advanced facet arthropathy in the upper cervical spine with with 0 chronic anterolisthesis at C2-3 and C3-4. 4. Advanced lower cervical degenerative disc narrowing.   Electronically Signed   By: Marnee Spring M.D.   On: 10/13/2014 11:54   Dg Thoracic Spine 2 View  10/13/2014   CLINICAL DATA:  Unwitnessed fall of uncertain cause. Back pain. Initial encounter.  EXAM: THORACIC SPINE - 2 VIEW  COMPARISON:  Two-view chest radiograph 10/03/2013  FINDINGS: There are 12 pairs of ribs. Slight left convex curvature of the upper lumbar spine is partially visualized. The upper thoracic spine is suboptimally evaluated on the lateral radiograph due to osteopenia and overlapping shoulder tissues. Allowing for this, no thoracic spine listhesis or definite acute compression fracture is identified. There is at most minimal anterior height loss of a mid thoracic vertebral body which is unchanged. No destructive osseous lesion is identified. Diffuse aortic calcification is noted.  IMPRESSION: No acute osseous abnormality identified in the thoracic spine.   Electronically Signed   By: Sebastian Ache   On: 10/13/2014 11:54   Dg Lumbar Spine Complete  10/18/2014  CLINICAL DATA:  Lost her balance and fell tonight while walking from the bathroom.  EXAM: LUMBAR SPINE - COMPLETE 4+ VIEW  COMPARISON:  None.  FINDINGS: The lumbar  vertebrae are normal in height. There is moderate thoracolumbar curvature. There is moderately severe degenerative disc disease at L4-5. There is moderate facet arthritis from L4 through the sacrum. There is no bone lesion or bony destruction. There is no evidence of acute fracture.  IMPRESSION: Negative for acute fracture. Curvature and moderately severe degenerative changes are present.   Electronically Signed   By: Ellery Plunkaniel R Mitchell M.D.   On: 10/18/2014 02:31   Dg Shoulder Right  10/18/2014   CLINICAL DATA:  Right shoulder pain after fall. Patient reports she lost her balance coming from the bathroom.  EXAM: RIGHT SHOULDER - 2+ VIEW  COMPARISON:  None.  FINDINGS: No fracture or dislocation. The alignment and joint spaces are maintained. Minimal proliferative change at the acromioclavicular joint. No focal soft tissue abnormality.  IMPRESSION: No fracture or dislocation of the right shoulder.   Electronically Signed   By: Rubye OaksMelanie  Ehinger M.D.   On: 10/18/2014 03:13   Ct Head Wo Contrast  10/18/2014   CLINICAL DATA:  Unwitnessed fall, small laceration of back of head. Patient reports head pain.  EXAM: CT HEAD WITHOUT CONTRAST  TECHNIQUE: Contiguous axial images were obtained from the base of the skull through the vertex without intravenous contrast.  COMPARISON:  Head CT 5 days prior 10/13/2014  FINDINGS: No intracranial hemorrhage, mass effect, or midline shift. No hydrocephalus. The basilar cisterns are patent. No evidence of territorial infarct. No intracranial fluid collection. Age-related atrophy and mild chronic small vessel ischemia, stable. Unchanged lacunar infarct in the anterior limb of the right internal capsule. Tiny lacunar infarct left cerebellum, unchanged. Calvarium is intact. No calvarial fracture. Persistent mid and free fluid level in the left maxillary sinus, mild mucosal thickening throughout the ethmoid air cells. Skin staples noted right parietal region.  IMPRESSION: No acute  intracranial abnormality.   Electronically Signed   By: Rubye OaksMelanie  Ehinger M.D.   On: 10/18/2014 02:08   Ct Head Wo Contrast  10/13/2014   CLINICAL DATA:  Fall and head injury  EXAM: CT HEAD WITHOUT CONTRAST  TECHNIQUE: Contiguous axial images were obtained from the base of the skull through the vertex without intravenous contrast.  COMPARISON:  CT dated 07/02/2014  FINDINGS: There is mild prominence of the ventricles and sulci compatible with age-related atrophy. Periventricular and deep white matter hypodensities represent chronic microvascular ischemic changes. Stable right anterior internal capsule old lacunar infarct. Stable subcentimeter left cerebellar hypodense focus again noted. There is no intracranial hemorrhage. No mass effect or midline shift identified.  There is partial opacification of the left maxillary sinus. The remainder of the visualized paranasal sinuses and mastoid air cells are well aerated. The calvarium is intact. Right posterior parietal scalp swelling.  IMPRESSION: No acute intracranial hemorrhage.  Age-related atrophy and chronic microvascular ischemic disease. Stable old right internal capsule lacunar infarct.   Electronically Signed   By: Elgie CollardArash  Radparvar M.D.   On: 10/13/2014 11:39   Dg Shoulder Left  10/13/2014   CLINICAL DATA:  Pain following fall  EXAM: LEFT SHOULDER - 2+ VIEW  COMPARISON:  Chronic  FINDINGS: Frontal and Y scapular images were obtained. There is evidence of a chronic displaced fracture of the proximal humeral metaphysis. The humeral shaft is displaced medially with lateral angulation distally with sclerosis along the fracture margins consistent with chronic nonunion  and displacement. No acute fracture is apparent. The humeral head remains position within the shoulder joint but is rotated, a stable finding. There is osteoarthritic change in the glenohumeral joint.  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 No acute fracture. No frank dislocation. Moderate osteoarthritic change.   Electronically Signed   By: Bretta Bang III M.D.   On: 10/13/2014 11:55   Dg Hip Unilat With Pelvis 2-3 Views Right  10/18/2014   CLINICAL DATA:  Right hip pain after fall. Patient reports she lost her balance coming from the bathroom.  EXAM: RIGHT HIP (WITH PELVIS) 2-3 VIEWS  COMPARISON:  Right hip radiographs 5 days prior 10/13/2014  FINDINGS: The cortical margins of the bony pelvis and right hip are intact. No fracture. Pubic symphysis and sacroiliac joints are congruent. Both femoral heads are well-seated in the respective acetabula. Vascular calcifications are seen.  IMPRESSION: No fracture of the bony pelvis or right hip.   Electronically Signed   By: Rubye Oaks M.D.   On: 10/18/2014 03:08   Dg Hip Unilat With Pelvis 2-3 Views Right  10/13/2014   CLINICAL DATA:  Unwitnessed fall with head laceration.  Dementia.  EXAM: RIGHT HIP (WITH PELVIS) 2-3 VIEWS  COMPARISON:  None.  FINDINGS: There is no evidence of hip fracture or dislocation. There is no significant hip arthropathy or other focal bone abnormality.  Arterial calcification and atherosclerosis.  IMPRESSION: No acute findings.   Electronically Signed   By: Marnee Spring M.D.   On: 10/13/2014 11:57    ____________________________________________   PROCEDURES  Procedure(s) performed: None  Critical Care performed: No  ____________________________________________   INITIAL IMPRESSION / ASSESSMENT AND PLAN / ED COURSE  Pertinent labs & imaging results that were available during my care of the patient were reviewed by me and considered in my medical decision making (see chart for details).  ----------------------------------------- 4:23 AM on 10/18/2014 -----------------------------------------  Patient require multiple x-rays which were all essentially negative except the chest x-ray showed some shadowing in which they  wanted to repeat the chest x-ray in about a week. That information will go back to the nursing care facility with the patient. Patient continued to have pain in the ED and was started on tramadol. And again patient will be sent back to nursing care facility to follow up as needed. ____________________________________________   FINAL CLINICAL IMPRESSION(S) / ED DIAGNOSES  Final diagnoses:  Pain  Fall, initial encounter  Multiple contusions      Leona Carry, MD 10/18/14 [phone]

## 2014-10-18 NOTE — ED Notes (Signed)
Called and spoke with EMS crew chief about getting patient;s paperwork, DNR, and [facility]. Crew chief had an emergency and will need to call this RN back. ED charge nurse made aware.

## 2014-12-11 ENCOUNTER — Encounter: Payer: Self-pay | Admitting: Intensive Care

## 2014-12-11 ENCOUNTER — Emergency Department: Payer: Medicare Other

## 2014-12-11 ENCOUNTER — Inpatient Hospital Stay
Admission: EM | Admit: 2014-12-11 | Discharge: 2014-12-15 | DRG: 177 | Disposition: A | Discharge status: Hospice | Payer: Medicare Other | Attending: Internal Medicine | Admitting: Internal Medicine

## 2014-12-11 DIAGNOSIS — I2510 Atherosclerotic heart disease of native coronary artery without angina pectoris: Secondary | ICD-10-CM | POA: Diagnosis present

## 2014-12-11 DIAGNOSIS — Z9842 Cataract extraction status, left eye: Secondary | ICD-10-CM | POA: Diagnosis not present

## 2014-12-11 DIAGNOSIS — F0390 Unspecified dementia without behavioral disturbance: Secondary | ICD-10-CM | POA: Diagnosis present

## 2014-12-11 DIAGNOSIS — Z66 Do not resuscitate: Secondary | ICD-10-CM | POA: Diagnosis not present

## 2014-12-11 DIAGNOSIS — J9601 Acute respiratory failure with hypoxia: Secondary | ICD-10-CM | POA: Diagnosis present

## 2014-12-11 DIAGNOSIS — I129 Hypertensive chronic kidney disease with stage 1 through stage 4 chronic kidney disease, or unspecified chronic kidney disease: Secondary | ICD-10-CM | POA: Diagnosis present

## 2014-12-11 DIAGNOSIS — Z515 Encounter for palliative care: Secondary | ICD-10-CM | POA: Diagnosis not present

## 2014-12-11 DIAGNOSIS — E785 Hyperlipidemia, unspecified: Secondary | ICD-10-CM | POA: Diagnosis present

## 2014-12-11 DIAGNOSIS — Z7982 Long term (current) use of aspirin: Secondary | ICD-10-CM

## 2014-12-11 DIAGNOSIS — J189 Pneumonia, unspecified organism: Secondary | ICD-10-CM

## 2014-12-11 DIAGNOSIS — K222 Esophageal obstruction: Secondary | ICD-10-CM | POA: Diagnosis present

## 2014-12-11 DIAGNOSIS — I252 Old myocardial infarction: Secondary | ICD-10-CM | POA: Diagnosis not present

## 2014-12-11 DIAGNOSIS — Z791 Long term (current) use of non-steroidal anti-inflammatories (NSAID): Secondary | ICD-10-CM | POA: Diagnosis not present

## 2014-12-11 DIAGNOSIS — J690 Pneumonitis due to inhalation of food and vomit: Secondary | ICD-10-CM | POA: Diagnosis not present

## 2014-12-11 DIAGNOSIS — I517 Cardiomegaly: Secondary | ICD-10-CM | POA: Diagnosis present

## 2014-12-11 DIAGNOSIS — Z87891 Personal history of nicotine dependence: Secondary | ICD-10-CM | POA: Diagnosis not present

## 2014-12-11 DIAGNOSIS — N183 Chronic kidney disease, stage 3 (moderate): Secondary | ICD-10-CM | POA: Diagnosis present

## 2014-12-11 DIAGNOSIS — Z8249 Family history of ischemic heart disease and other diseases of the circulatory system: Secondary | ICD-10-CM | POA: Diagnosis not present

## 2014-12-11 DIAGNOSIS — R4182 Altered mental status, unspecified: Secondary | ICD-10-CM

## 2014-12-11 DIAGNOSIS — N170 Acute kidney failure with tubular necrosis: Secondary | ICD-10-CM | POA: Diagnosis present

## 2014-12-11 DIAGNOSIS — R4702 Dysphasia: Secondary | ICD-10-CM | POA: Diagnosis present

## 2014-12-11 DIAGNOSIS — R1310 Dysphagia, unspecified: Secondary | ICD-10-CM | POA: Diagnosis present

## 2014-12-11 DIAGNOSIS — Z79899 Other long term (current) drug therapy: Secondary | ICD-10-CM | POA: Diagnosis not present

## 2014-12-11 DIAGNOSIS — F919 Conduct disorder, unspecified: Secondary | ICD-10-CM | POA: Diagnosis present

## 2014-12-11 DIAGNOSIS — Z91018 Allergy to other foods: Secondary | ICD-10-CM

## 2014-12-11 DIAGNOSIS — E039 Hypothyroidism, unspecified: Secondary | ICD-10-CM | POA: Diagnosis present

## 2014-12-11 DIAGNOSIS — Z9841 Cataract extraction status, right eye: Secondary | ICD-10-CM

## 2014-12-11 LAB — CBC WITH DIFFERENTIAL/PLATELET
BASOS PCT: 1 %
Basophils Absolute: 0.1 10*3/uL (ref 0–0.1)
Eosinophils Absolute: 0.1 10*3/uL (ref 0–0.7)
Eosinophils Relative: 1 %
HEMATOCRIT: 38.3 % (ref 35.0–47.0)
HEMOGLOBIN: 12.1 g/dL (ref 12.0–16.0)
LYMPHS ABS: 0.9 10*3/uL — AB (ref 1.0–3.6)
Lymphocytes Relative: 8 %
MCH: 29.4 pg (ref 26.0–34.0)
MCHC: 31.6 g/dL — AB (ref 32.0–36.0)
MCV: 93.2 fL (ref 80.0–100.0)
MONO ABS: 0.6 10*3/uL (ref 0.2–0.9)
MONOS PCT: 5 %
NEUTROS ABS: 10.4 10*3/uL — AB (ref 1.4–6.5)
NEUTROS PCT: 85 %
Platelets: 252 10*3/uL (ref 150–440)
RBC: 4.11 MIL/uL (ref 3.80–5.20)
RDW: 15.4 % — AB (ref 11.5–14.5)
WBC: 12.1 10*3/uL — ABNORMAL HIGH (ref 3.6–11.0)

## 2014-12-11 LAB — COMPREHENSIVE METABOLIC PANEL
ALBUMIN: 3.5 g/dL (ref 3.5–5.0)
ALK PHOS: 75 U/L (ref 38–126)
ALT: 12 U/L — AB (ref 14–54)
ANION GAP: 10 (ref 5–15)
AST: 21 U/L (ref 15–41)
BUN: 28 mg/dL — ABNORMAL HIGH (ref 6–20)
CALCIUM: 9.2 mg/dL (ref 8.9–10.3)
CHLORIDE: 104 mmol/L (ref 101–111)
CO2: 27 mmol/L (ref 22–32)
Creatinine, Ser: 1.63 mg/dL — ABNORMAL HIGH (ref 0.44–1.00)
GFR calc Af Amer: 33 mL/min — ABNORMAL LOW (ref >60)
GFR calc non Af Amer: 28 mL/min — ABNORMAL LOW (ref >60)
GLUCOSE: 112 mg/dL — AB (ref 65–99)
Potassium: 3.9 mmol/L (ref 3.5–5.1)
SODIUM: 141 mmol/L (ref 135–145)
Total Bilirubin: 0.2 mg/dL — ABNORMAL LOW (ref 0.3–1.2)
Total Protein: 7.5 g/dL (ref 6.5–8.1)

## 2014-12-11 LAB — TROPONIN I: Troponin I: <0.03 ng/mL (ref <0.031)

## 2014-12-11 MED ORDER — LORAZEPAM 0.5 MG PO TABS: 0.5000 mg | ORAL_TABLET | Freq: Three times a day (TID) | ORAL | Status: DC | PRN

## 2014-12-11 MED ORDER — SERTRALINE HCL 25 MG PO TABS
25.0000 mg | ORAL_TABLET | Freq: Every day | ORAL | Status: DC
Start: 2014-12-11 — End: 2014-12-14

## 2014-12-11 MED ORDER — PIPERACILLIN-TAZOBACTAM 3.375 G IVPB
3.3750 g | Freq: Three times a day (TID) | INTRAVENOUS | Status: DC
  Administered 2014-12-12: 3.375 g via INTRAVENOUS
  Filled 2014-12-11 (×6): qty 50

## 2014-12-11 MED ORDER — AMLODIPINE BESYLATE 10 MG PO TABS: 10.0000 mg | ORAL_TABLET | Freq: Two times a day (BID) | ORAL | Status: DC

## 2014-12-11 MED ORDER — ATORVASTATIN CALCIUM 20 MG PO TABS
40.0000 mg | ORAL_TABLET | Freq: Every day | ORAL | Status: DC
  Filled 2014-12-11: qty 2

## 2014-12-11 MED ORDER — SENNA 8.6 MG PO TABS: 1.0000 | ORAL_TABLET | Freq: Every day | ORAL | Status: DC | PRN

## 2014-12-11 MED ORDER — ALBUTEROL SULFATE (2.5 MG/3ML) 0.083% IN NEBU: 2.5000 mg | INHALATION_SOLUTION | RESPIRATORY_TRACT | Status: DC | PRN

## 2014-12-11 MED ORDER — ACETAMINOPHEN 650 MG RE SUPP: 650.0000 mg | Freq: Four times a day (QID) | RECTAL | Status: DC | PRN

## 2014-12-11 MED ORDER — ACETAMINOPHEN 325 MG PO TABS: 650.0000 mg | ORAL_TABLET | Freq: Four times a day (QID) | ORAL | Status: DC | PRN

## 2014-12-11 MED ORDER — NITROGLYCERIN 0.4 MG SL SUBL: 0.4000 mg | SUBLINGUAL_TABLET | SUBLINGUAL | Status: DC | PRN

## 2014-12-11 MED ORDER — FERROUS SULFATE 325 (65 FE) MG PO TABS: 325.0000 mg | ORAL_TABLET | Freq: Every day | ORAL | Status: DC

## 2014-12-11 MED ORDER — DOCUSATE SODIUM 100 MG PO CAPS: 100.0000 mg | ORAL_CAPSULE | Freq: Two times a day (BID) | ORAL | Status: DC | PRN

## 2014-12-11 MED ORDER — DICLOFENAC SODIUM 1 % TD GEL
2.0000 g | Freq: Three times a day (TID) | TRANSDERMAL | Status: DC
  Administered 2014-12-12 – 2014-12-14 (×7): 2 g via TOPICAL
  Filled 2014-12-11: qty 100

## 2014-12-11 MED ORDER — BENZONATATE 100 MG PO CAPS: 100.0000 mg | ORAL_CAPSULE | Freq: Three times a day (TID) | ORAL | Status: DC | PRN

## 2014-12-11 MED ORDER — CALCIUM CARBONATE-VITAMIN D 500-200 MG-UNIT PO TABS
1.0000 | ORAL_TABLET | Freq: Two times a day (BID) | ORAL | Status: DC
  Filled 2014-12-11: qty 1

## 2014-12-11 MED ORDER — RISPERIDONE 1 MG PO TABS
1.0000 mg | ORAL_TABLET | Freq: Three times a day (TID) | ORAL | Status: DC
  Filled 2014-12-11: qty 1

## 2014-12-11 MED ORDER — SODIUM CHLORIDE 0.9 % IV SOLN
INTRAVENOUS | Status: DC
  Administered 2014-12-12 – 2014-12-14 (×3): via INTRAVENOUS

## 2014-12-11 MED ORDER — TRAMADOL HCL 50 MG PO TABS
25.0000 mg | ORAL_TABLET | Freq: Two times a day (BID) | ORAL | Status: DC
  Filled 2014-12-11: qty 1

## 2014-12-11 MED ORDER — TRAMADOL HCL 50 MG PO TABS: 50.0000 mg | ORAL_TABLET | Freq: Four times a day (QID) | ORAL | Status: DC | PRN

## 2014-12-11 MED ORDER — ENOXAPARIN SODIUM 30 MG/0.3ML ~~LOC~~ SOLN: 30.0000 mg | SUBCUTANEOUS | Status: DC

## 2014-12-11 MED ORDER — DIPHENHYDRAMINE HCL 25 MG PO CAPS: 25.0000 mg | ORAL_CAPSULE | Freq: Three times a day (TID) | ORAL | Status: DC | PRN

## 2014-12-11 MED ORDER — CLINDAMYCIN PHOSPHATE 600 MG/50ML IV SOLN
600.0000 mg | Freq: Once | INTRAVENOUS | Status: AC
  Administered 2014-12-11: 600 mg via INTRAVENOUS
  Filled 2014-12-11: qty 50

## 2014-12-11 MED ORDER — DEXTROSE 5 % IV SOLN
500.0000 mg | Freq: Once | INTRAVENOUS | Status: AC
  Administered 2014-12-11: 500 mg via INTRAVENOUS
  Filled 2014-12-11: qty 500

## 2014-12-11 MED ORDER — DEXTROSE 5 % IV SOLN
1.0000 g | INTRAVENOUS | Status: AC
  Administered 2014-12-11: 1 g via INTRAVENOUS
  Filled 2014-12-11: qty 10

## 2014-12-11 MED ORDER — ASPIRIN 81 MG PO CHEW: 81.0000 mg | CHEWABLE_TABLET | Freq: Every day | ORAL | Status: DC

## 2014-12-11 NOTE — ED Notes (Signed)
Patient transported to CT 

## 2014-12-11 NOTE — ED Notes (Signed)
Patient arrived by EMS from [facility]. According to staff patient has been coughing up flem X1 week and has rattling in lungs. EMS reported it "sounds like patient aspirated". Per EMS O2 is anywhere between 84-90 with 4L

## 2014-12-11 NOTE — H&P (Signed)
[facility] [place] at [facility]   PATIENT NAME: Ebony Harper    MR#:  [id number]  DATE OF BIRTH:  05-03-1930  DATE OF ADMISSION:  12/11/2014  PRIMARY CARE PHYSICIAN: Drs. making house calls  REQUESTING/REFERRING PHYSICIAN: Dr. York Cerise  CHIEF COMPLAINT:   Chief Complaint  Patient presents with  . Aspiration    HISTORY OF PRESENT ILLNESS:  Ebony Harper  is a 81 y.o. female with a known history of coronary artery disease, severe dementia, hypothyroidism hypertension presents from [facility] secondary to an episode of aspiration resulting in hypoxia and difficulty breathing. Patient is severely demented so most of the history is obtained from the ER physician and also patient's power of attorney at bedside. Patient has been living at [facility] for almost a year now. She is independent and ambulatory at baseline but is very demented with behavioral disturbances. She gets agitated and confused often. No prior history of any aspiration. She is on a regular diet and this afternoon while she was eating suddenly she had a bout of cough after 2 bites and resulted in difficulty breathing and was hypoxic on presentation here to the emergency room. Chest x-ray shows a new right middle lobe infiltrate. So patient is being admitted for aspiration pneumonia  PAST MEDICAL HISTORY:   Past Medical History  Diagnosis Date  . Dementia   . MI (myocardial infarction)   . Renal disorder   . Thyroid disease   . Hyperlipidemia   . Hypertension     PAST SURGICAL HISTORY:   Past Surgical History  Procedure Laterality Date  . Cataract Bilateral     SOCIAL HISTORY:   Social History  Substance Use Topics  . Smoking status: Former Smoker    Types: Cigarettes    Quit date: 12/11/2011  . Smokeless tobacco: Never Used     Comment: Patient was a heavy smoker before  . Alcohol Use: No    FAMILY HISTORY:   Family  History  Problem Relation Age of Onset  . CAD Mother     DRUG ALLERGIES:   Allergies  Allergen Reactions  . Strawberry     REVIEW OF SYSTEMS:   Review of Systems  Unable to perform ROS: dementia    MEDICATIONS AT HOME:   Prior to Admission medications   Medication Sig Start Date End Date Taking? Authorizing Provider  acetaminophen (TYLENOL) 500 MG tablet Take 1,000 mg by mouth 3 (three) times daily.    Historical Provider, MD  amLODipine (NORVASC) 10 MG tablet Take 10 mg by mouth daily.    Historical Provider, MD  aspirin 81 MG chewable tablet Chew 81 mg by mouth daily.    Historical Provider, MD  atorvastatin (LIPITOR) 40 MG tablet Take 40 mg by mouth at bedtime.    Historical Provider, MD  benzonatate (TESSALON) 100 MG capsule Take 100 mg by mouth every 8 (eight) hours as needed for cough.    Historical Provider, MD  Calcium Carbonate-Vitamin D (CALCIUM 600-D) 600-400 MG-UNIT per tablet Take 1 tablet by mouth 2 (two) times daily.    Historical Provider, MD  diclofenac sodium (VOLTAREN) 1 % GEL Apply 2 g topically 3 (three) times daily.    Historical Provider, MD  docusate sodium (COLACE) 100 MG capsule Take 100 mg by mouth 2 (two) times daily.    Historical Provider, MD  ferrous sulfate 325 (65 FE) MG tablet Take 325 mg by mouth daily.    Historical  Provider, MD  levothyroxine (SYNTHROID, LEVOTHROID) 137 MCG tablet Take 137 mcg by mouth daily.    Historical Provider, MD  LORazepam (ATIVAN) 0.5 MG tablet Take 0.5 mg by mouth every 8 (eight) hours as needed (agitation).    Historical Provider, MD  nitroGLYCERIN (NITROSTAT) 0.4 MG SL tablet Place 0.4 mg under the tongue every 5 (five) minutes as needed for chest pain.    Historical Provider, MD  risperiDONE (RISPERDAL) 1 MG tablet Take 1 mg by mouth 3 (three) times daily.    Historical Provider, MD  senna-docusate (SENOKOT-S) 8.6-50 MG per tablet Take 1 tablet by mouth at bedtime.    Historical Provider, MD  sertraline (ZOLOFT) 25  MG tablet Take 25 mg by mouth daily.    Historical Provider, MD  traMADol (ULTRAM) 50 MG tablet Take 25 mg by mouth 2 (two) times daily.    Historical Provider, MD  traMADol (ULTRAM) 50 MG tablet Take 1 tablet (50 mg total) by mouth every 6 (six) hours as needed. 10/18/14 10/18/15  Leona Carry, MD      VITAL SIGNS:  Blood pressure 136/61, pulse 60, temperature 97.5 F (36.4 C), resp. rate 15, SpO2 94 %.  PHYSICAL EXAMINATION:   Physical Exam  GENERAL:  81 y.o.-year-old elderly patient lying in the bed with no acute distress.  EYES: Pupils equal, round, reactive to light and accommodation. No scleral icterus. Extraocular muscles intact. Post surgical pupils noted. HEENT: Head atraumatic, normocephalic. Oropharynx and nasopharynx clear. Poor dentition NECK:  Supple, no jugular venous distention. No thyroid enlargement, no tenderness.  LUNGS: No use of accessory muscles of respiration. No wheezing. Fine rhonchi at the right base with decreased breath sounds. CARDIOVASCULAR: S1, S2 normal. 3 /6 systolic murmur present, no rubs, or gallops.  ABDOMEN: Soft, nontender, nondistended. Bowel sounds present. No organomegaly or mass.  EXTREMITIES: No pedal edema, cyanosis, or clubbing.  NEUROLOGIC: Cranial nerves II through XII are intact. Following commands. Able to move all 4 extremities. Unable to cooperate for complete neuro exam due to her dementia. Gait not tested. PSYCHIATRIC: The patient is alert and oriented to self.  SKIN: No obvious rash, lesion, or ulcer.   LABORATORY PANEL:   CBC  Recent Labs Lab 12/11/14 1610  WBC 12.1*  HGB 12.1  HCT 38.3  PLT 252   ------------------------------------------------------------------------------------------------------------------  Chemistries   Recent Labs Lab 12/11/14 1610  NA 141  K 3.9  CL 104  CO2 27  GLUCOSE 112*  BUN 28*  CREATININE 1.63*  CALCIUM 9.2  AST 21  ALT 12*  ALKPHOS 75  BILITOT 0.2*    ------------------------------------------------------------------------------------------------------------------  Cardiac Enzymes  Recent Labs Lab 12/11/14 1610  TROPONINI <0.03   ------------------------------------------------------------------------------------------------------------------  RADIOLOGY:  Dg Chest 2 View  12/11/2014   CLINICAL DATA:  Patient arrived by EMS from [facility]. According to staff patient has been coughing up flem x1 week and has rattling in lungs. Concern for aspiration.  EXAM: CHEST  2 VIEW  COMPARISON:  10/18/2014  FINDINGS: There is opacity in the right middle lobe along the oblique fissure that is likely atelectasis. Pneumonia or pneumonitis is possible.  Lungs are mildly hyperexpanded but otherwise clear. Moderate size hiatal hernia is stable. No pleural effusion or pneumothorax.  Cardiac silhouette is mildly enlarged. No mediastinal or hilar masses or convincing adenopathy.  Bony thorax is demineralized. 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  IMPRESSION: 1. Opacity along the oblique fissure in the right middle lobe is new from  the prior exam. This may be atelectasis. Pneumonia/pneumonitis is possible. 2. No other evidence of an acute abnormality. 3. Moderate hiatal hernia.  Mild cardiomegaly.   Electronically Signed   By: Amie Portland M.D.   On: 12/11/2014 15:54   Ct Head Wo Contrast  12/11/2014   CLINICAL DATA:  Recurrent falls at home.  EXAM: CT HEAD WITHOUT CONTRAST  TECHNIQUE: Contiguous axial images were obtained from the base of the skull through the vertex without intravenous contrast.  COMPARISON:  10/18/2014  FINDINGS: There is no evidence of intracranial hemorrhage, brain edema, or other signs of acute infarction. There is no evidence of intracranial mass lesion or mass effect. No abnormal extraaxial fluid collections are identified.  Mild cerebral atrophy and chronic small vessel disease again demonstrated. Old  lacunar infarcts involving the anterior limb of the right internal capsule and the left cerebellum are again demonstrated. Ventricles are stable in size. No skull abnormality identified.  IMPRESSION: No acute intracranial abnormality.  Stable mild cerebral atrophy, chronic small vessel disease, and old lacunar infarcts.   Electronically Signed   By: Myles Rosenthal M.D.   On: 12/11/2014 15:45    EKG:   Orders placed or performed during the hospital encounter of 12/11/14  . ED EKG  . ED EKG    IMPRESSION AND PLAN:   Ebony Harper  is a 81 y.o. female with a known history of coronary artery disease, severe dementia, hypothyroidism hypertension presents from [facility] secondary to an episode of aspiration resulting in hypoxia and difficulty breathing.  #1 aspiration pneumonia-continue oxygen support. -Keep nothing by mouth and get a speech therapy consult. -Started on Zosyn -Aspiration precautions.  #2 severe dementia-seems to be at baseline. -Does have significant behavioral disturbances and agitation episodes. But Easily oversedated with new medications and sedatives. -Continue risperidone 3 times a day and also low-dose Ativan when necessary which are her home medications.  #3 hypertension-continue Norvasc  #4 hyperlipidemia-on statin  #5 DVT prophylaxis-Lovenox  #6 acute on chronic renal failure-gentle hydration and recheck.  Physical therapy has been consulted and also social worker consult for return back to assisted living facility.    All the records are reviewed and case discussed with ED provider. Management plans discussed with the patient, family and they are in agreement.  CODE STATUS: DO NOT RESUSCITATE  TOTAL TIME TAKING CARE OF THIS PATIENT: 50 minutes.    Enid Baas M.D on 12/11/2014 at 6:20 PM  Between 7am to 6pm - Pager - [phone]  After 6pm go to [email or web address] - password [email or web address]  [facility]  Office   [phone]  CC: Primary care physician; Pcp Not In System

## 2014-12-11 NOTE — ED Provider Notes (Signed)
[facility] Emergency Department Provider Note  ____________________________________________  Time seen: Approximately 3:07 PM  I have reviewed the triage vital signs and the nursing notes.   HISTORY  Chief Complaint Aspiration  History is limited by patient's advanced dementia  HPI Ebony Harper is a 81 y.o. female with a history that includes advanced dementiawho lives at a nursing facility and presents by EMS after an aspiration episode.  Reportedly the patient has had a productive cough for about one week with "rattling in her lungs".  She was having lunch when she began coughing and choking and spitting out food and/or phlegm.  She continued to be in respiratory distress and then once she settled down, she tried to take a sip of water and began coughing again.  This happened subsequently with diet coke as well.  When EMS arrived they said that her oxygen saturation was in the mid 80s and no higher than 90% on 4 L of oxygen by nasal cannula.  I confirmed that she does not typically use oxygen and has no known history of lung disease.  The patient cannot provide me with any history because of her dementia but does confirm that she feels like she is having some trouble breathing.  She is alert and oriented at her baseline according to her friend and power of attorney who is present in the emergency department.  She is not retracting but is coughing frequently and has wheezing and gurgling in her upper airway easily heard without using a stethoscope.  Her episode was described as acute in onset and severe.  She does not complain of any chest pain, abdominal pain, nausea, vomiting, or numbness or weakness in any of her extremities.   Past Medical History  Diagnosis Date  . Dementia   . MI (myocardial infarction)   . Renal disorder   . Thyroid disease   . Hyperlipidemia   . Hypertension     There are no active problems to display for this patient.   History  reviewed. No pertinent past surgical history.  Current Outpatient Rx  Name  Route  Sig  Dispense  Refill  . acetaminophen (TYLENOL) 500 MG tablet   Oral   Take 1,000 mg by mouth 3 (three) times daily.         Marland Kitchen amLODipine (NORVASC) 10 MG tablet   Oral   Take 10 mg by mouth daily.         Marland Kitchen aspirin 81 MG chewable tablet   Oral   Chew 81 mg by mouth daily.         Marland Kitchen atorvastatin (LIPITOR) 40 MG tablet   Oral   Take 40 mg by mouth at bedtime.         . benzonatate (TESSALON) 100 MG capsule   Oral   Take 100 mg by mouth every 8 (eight) hours as needed for cough.         . Calcium Carbonate-Vitamin D (CALCIUM 600-D) 600-400 MG-UNIT per tablet   Oral   Take 1 tablet by mouth 2 (two) times daily.         . diclofenac sodium (VOLTAREN) 1 % GEL   Topical   Apply 2 g topically 3 (three) times daily.         Marland Kitchen docusate sodium (COLACE) 100 MG capsule   Oral   Take 100 mg by mouth 2 (two) times daily.         . ferrous sulfate 325 (65 FE)  MG tablet   Oral   Take 325 mg by mouth daily.         Marland Kitchen levothyroxine (SYNTHROID, LEVOTHROID) 137 MCG tablet   Oral   Take 137 mcg by mouth daily.         Marland Kitchen LORazepam (ATIVAN) 0.5 MG tablet   Oral   Take 0.5 mg by mouth every 8 (eight) hours as needed (agitation).         . nitroGLYCERIN (NITROSTAT) 0.4 MG SL tablet   Sublingual   Place 0.4 mg under the tongue every 5 (five) minutes as needed for chest pain.         Marland Kitchen risperiDONE (RISPERDAL) 1 MG tablet   Oral   Take 1 mg by mouth 3 (three) times daily.         Marland Kitchen senna-docusate (SENOKOT-S) 8.6-50 MG per tablet   Oral   Take 1 tablet by mouth at bedtime.         . sertraline (ZOLOFT) 25 MG tablet   Oral   Take 25 mg by mouth daily.         . traMADol (ULTRAM) 50 MG tablet   Oral   Take 25 mg by mouth 2 (two) times daily.         . traMADol (ULTRAM) 50 MG tablet   Oral   Take 1 tablet (50 mg total) by mouth every 6 (six) hours as needed.   20  tablet   0     Allergies Strawberry  History reviewed. No pertinent family history.  Social History Social History  Substance Use Topics  . Smoking status: Former Smoker    Types: Cigarettes  . Smokeless tobacco: Never Used  . Alcohol Use: No    Review of Systems  Unable to obtain due to the patient's dementia ____________________________________________   PHYSICAL EXAM:  ED Triage Vitals  Enc Vitals Group     BP 12/11/14 1700 141/63 mmHg     Pulse Rate 12/11/14 1700 64     Resp 12/11/14 1700 14     Temp 12/11/14 1627 97.5 F (36.4 C)     Temp src --      SpO2 12/11/14 1700 75 %     Weight --      Height --      Head Cir --      Peak Flow --      Pain Score 12/11/14 1451 5     Pain Loc --      Pain Edu? --      Excl. in GC? --       Constitutional: Alert and oriented to self which is her baseline.  Elderly, thick coughing and audible wheezing and gurgling. Eyes: Conjunctivae are normal. PERRL. EOMI. Head: Atraumatic.  Very poor dentition chronically Nose: No congestion/rhinnorhea. Mouth/Throat: Thick frequent productive cough. Neck: No stridor but loud upper airway breathing noises.   Cardiovascular: Borderline tachycardia, regular rhythm. Grossly normal heart sounds.  Good peripheral circulation. Respiratory: No retractions but the patient is frequently choking with thick wheezing upper airway noises and frequent productive cough. Gastrointestinal: Soft and nontender. No distention. No abdominal bruits. No CVA tenderness. Musculoskeletal: No lower extremity tenderness nor edema.  No joint effusions. Neurologic:  Normal speech and language. No gross focal neurologic deficits are appreciated.  Skin:  Skin is warm, dry and intact. No rash noted.  The patient is moving all 4 of her extremities and has no appreciable focal neurological deficits   ____________________________________________  LABS (all labs ordered are listed, but only abnormal results are  displayed)  Labs Reviewed  COMPREHENSIVE METABOLIC PANEL - Abnormal; Notable for the following:    Glucose, Bld 112 (*)    BUN 28 (*)    Creatinine, Ser 1.63 (*)    ALT 12 (*)    Total Bilirubin 0.2 (*)    GFR calc non Af Amer 28 (*)    GFR calc Af Amer 33 (*)    All other components within normal limits  CBC WITH DIFFERENTIAL/PLATELET - Abnormal; Notable for the following:    WBC 12.1 (*)    MCHC 31.6 (*)    RDW 15.4 (*)    Neutro Abs 10.4 (*)    Lymphs Abs 0.9 (*)    All other components within normal limits  CULTURE, BLOOD (ROUTINE X 2)  CULTURE, BLOOD (ROUTINE X 2)  TROPONIN I   ____________________________________________  EKG  ED ECG REPORT I, Yariana Hoaglund, the attending physician, personally viewed and interpreted this ECG.  Date: 12/11/2014 EKG Time: 16:15 Rate: 60 Rhythm: normal sinus rhythm QRS Axis: Left axis deviation Intervals: normal ST/T Wave abnormalities: normal Conduction Disutrbances: none Narrative Interpretation: unremarkable  ____________________________________________  RADIOLOGY I, Gopal Malter, personally viewed and evaluated these images (plain radiographs) as part of my medical decision making.   Dg Chest 2 View  12/11/2014   CLINICAL DATA:  Patient arrived by EMS from [facility]. According to staff patient has been coughing up flem x1 week and has rattling in lungs. Concern for aspiration.  EXAM: CHEST  2 VIEW  COMPARISON:  10/18/2014  FINDINGS: There is opacity in the right middle lobe along the oblique fissure that is likely atelectasis. Pneumonia or pneumonitis is possible.  Lungs are mildly hyperexpanded but otherwise clear. Moderate size hiatal hernia is stable. No pleural effusion or pneumothorax.  Cardiac silhouette is mildly enlarged. No mediastinal or hilar masses or convincing adenopathy.  Bony thorax is demineralized. 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  IMPRESSION: 1. Opacity along the oblique  fissure in the right middle lobe is new from the prior exam. This may be atelectasis. Pneumonia/pneumonitis is possible. 2. No other evidence of an acute abnormality. 3. Moderate hiatal hernia.  Mild cardiomegaly.   Electronically Signed   By: Amie Portland M.D.   On: 12/11/2014 15:54   Ct Head Wo Contrast  12/11/2014   CLINICAL DATA:  Recurrent falls at home.  EXAM: CT HEAD WITHOUT CONTRAST  TECHNIQUE: Contiguous axial images were obtained from the base of the skull through the vertex without intravenous contrast.  COMPARISON:  10/18/2014  FINDINGS: There is no evidence of intracranial hemorrhage, brain edema, or other signs of acute infarction. There is no evidence of intracranial mass lesion or mass effect. No abnormal extraaxial fluid collections are identified.  Mild cerebral atrophy and chronic small vessel disease again demonstrated. Old lacunar infarcts involving the anterior limb of the right internal capsule and the left cerebellum are again demonstrated. Ventricles are stable in size. No skull abnormality identified.  IMPRESSION: No acute intracranial abnormality.  Stable mild cerebral atrophy, chronic small vessel disease, and old lacunar infarcts.   Electronically Signed   By: Myles Rosenthal M.D.   On: 12/11/2014 15:45    ____________________________________________   PROCEDURES  Procedure(s) performed: None  Critical Care performed: No ____________________________________________   INITIAL IMPRESSION / ASSESSMENT AND PLAN / ED COURSE  Pertinent labs & imaging results that were available during my care of the  patient were reviewed by me and considered in my medical decision making (see chart for details).  The patient has had an acute aspiration event with no history of swallowing difficulties, lung disease, or oxygen dependence.  She is hypoxemic and producing thick secretions.  Her oxygenation is adequate with nasal cannula, but I am concerned about the acute cause of this, whether  it was simply a choking episode or whether a CVA is possible and has limited her ability to swallow, given that she chokes every time she has a sip to drink.  For now I am putting her on aspiration precautions and asking the nurse to check a swallow study.  I will also obtain a head CT if she is able to tolerate lying flat for the exam.  She does not require intubation at this point that she may decompensate rapidly.  She has a DNR/DNI form on her chart, and I will further clarify with her POA.  ----------------------------------------- 5:35 PM on 12/11/2014 -----------------------------------------  The patient is breathing more comfortably but still desats to the mid 80s without oxygen.  She passed a bedside swallow study.  She has pneumonia versus pneumonitis on her chest x-ray.  If this was the first time she had issues I would assume it is a chemical pneumonitis and would not start empiric antibiotics.  However, she has a history of worsening productive cough for a week.  I believe it is safer for her to start empiric antibiotics treatment with ceftriaxone, azithromycin, and clindamycin for presumed aspiration.  I discussed with her power of attorney my concern that she may get worse before she gets better, and she understands.I did clarify that the patient definitely is DO NOT RESUSCITATE/DO NOT INTUBATE.  ____________________________________________  FINAL CLINICAL IMPRESSION(S) / ED DIAGNOSES  Final diagnoses:  Acute respiratory failure with hypoxemia  DNR (do not resuscitate)  CAP (community acquired pneumonia)  Aspiration pneumonia, unspecified aspiration pneumonia type      NEW MEDICATIONS STARTED DURING THIS VISIT:  New Prescriptions   No medications on file     Loleta Rose, MD 12/11/14 1750

## 2014-12-12 LAB — BASIC METABOLIC PANEL
ANION GAP: 10 (ref 5–15)
BUN: 26 mg/dL — AB (ref 6–20)
CHLORIDE: 106 mmol/L (ref 101–111)
CO2: 25 mmol/L (ref 22–32)
Calcium: 8.6 mg/dL — ABNORMAL LOW (ref 8.9–10.3)
Creatinine, Ser: 1.28 mg/dL — ABNORMAL HIGH (ref 0.44–1.00)
GFR calc Af Amer: 44 mL/min — ABNORMAL LOW (ref >60)
GFR, EST NON AFRICAN AMERICAN: 38 mL/min — AB (ref >60)
GLUCOSE: 121 mg/dL — AB (ref 65–99)
POTASSIUM: 4 mmol/L (ref 3.5–5.1)
Sodium: 141 mmol/L (ref 135–145)

## 2014-12-12 LAB — CBC
HEMATOCRIT: 37.8 % (ref 35.0–47.0)
HEMOGLOBIN: 12 g/dL (ref 12.0–16.0)
MCH: 29.7 pg (ref 26.0–34.0)
MCHC: 31.7 g/dL — AB (ref 32.0–36.0)
MCV: 93.7 fL (ref 80.0–100.0)
Platelets: 224 10*3/uL (ref 150–440)
RBC: 4.03 MIL/uL (ref 3.80–5.20)
RDW: 15.1 % — AB (ref 11.5–14.5)
WBC: 22.3 10*3/uL — ABNORMAL HIGH (ref 3.6–11.0)

## 2014-12-12 MED ORDER — SODIUM CHLORIDE 0.9 % IV SOLN
3.0000 g | Freq: Four times a day (QID) | INTRAVENOUS | Status: DC
  Administered 2014-12-12 – 2014-12-14 (×8): 3 g via INTRAVENOUS
  Filled 2014-12-12 (×12): qty 3

## 2014-12-12 MED ORDER — ENOXAPARIN SODIUM 40 MG/0.4ML ~~LOC~~ SOLN
40.0000 mg | SUBCUTANEOUS | Status: DC
  Administered 2014-12-12 – 2014-12-13 (×2): 40 mg via SUBCUTANEOUS
  Filled 2014-12-12 (×2): qty 0.4

## 2014-12-12 NOTE — Plan of Care (Signed)
Problem: Phase I Progression Outcomes Goal: Hemodynamically stable Outcome: Completed/Met Date Met:  12/12/14 Pt is alert but confused, hx demenita, on 2 L oxygen acutely, vital signs stable, denies pain, does not appear in physical distress, pt remains NPO after failing speech evaluation, plan is for patient to under go barium swallow study on 8/23, POA at bedside, physical therapy worked with patient, up to chair, IV antibiotics changed to ampicillin. Incontinent of urine, uneventful shift.

## 2014-12-12 NOTE — Progress Notes (Addendum)
[facility] [place] [facility]   PATIENT NAME: Ebony Harper    MR#:  [id number]  DATE OF BIRTH:  November 01, 1930  SUBJECTIVE:  CHIEF COMPLAINT:   Chief Complaint  Patient presents with  . Aspiration   seems quite lethargic, sitting in chair, her caregiver(health care power of attorney) is at bedside. REVIEW OF SYSTEMS:  Review of Systems  Unable to perform ROS: acuity of condition   DRUG ALLERGIES:   Allergies  Allergen Reactions  . Strawberry    VITALS:  Blood pressure 147/50, pulse 62, temperature 98.2 F (36.8 C), temperature source Oral, resp. rate 18, weight 71.215 kg (157 lb), SpO2 95 %. PHYSICAL EXAMINATION:  Physical Exam  Constitutional: She appears lethargic. She appears unhealthy. She has a sickly appearance.  HENT:  Head: Normocephalic and atraumatic.  Eyes: Conjunctivae and EOM are normal. Pupils are equal, round, and reactive to light.  Neck: Normal range of motion. Neck supple. No tracheal deviation present. No thyromegaly present.  Cardiovascular: Normal rate, regular rhythm and normal heart sounds.   Pulmonary/Chest: Effort normal and breath sounds normal. No respiratory distress. She has no wheezes. She exhibits no tenderness.  Abdominal: Soft. Bowel sounds are normal. She exhibits no distension. There is no tenderness.  Musculoskeletal: Normal range of motion.  Neurological: She appears lethargic. No cranial nerve deficit.  Lethargic  Skin: Skin is warm and dry. No rash noted.  Psychiatric: Affect normal.  Unable to assess due to her lethargy   LABORATORY PANEL:   CBC  Recent Labs Lab 12/12/14 0356  WBC 22.3*  HGB 12.0  HCT 37.8  PLT 224   ------------------------------------------------------------------------------------------------------------------ Chemistries   Recent Labs Lab 12/11/14 1610 12/12/14 0356  NA 141 141  K 3.9 4.0  CL 104 106  CO2 27 25  GLUCOSE 112* 121*  BUN 28* 26*  CREATININE 1.63*  1.28*  CALCIUM 9.2 8.6*  AST 21  --   ALT 12*  --   ALKPHOS 75  --   BILITOT 0.2*  --    RADIOLOGY:  Dg Chest 2 View  12/11/2014   CLINICAL DATA:  Patient arrived by EMS from [facility]. According to staff patient has been coughing up flem x1 week and has rattling in lungs. Concern for aspiration.  EXAM: CHEST  2 VIEW  COMPARISON:  10/18/2014  FINDINGS: There is opacity in the right middle lobe along the oblique fissure that is likely atelectasis. Pneumonia or pneumonitis is possible.  Lungs are mildly hyperexpanded but otherwise clear. Moderate size hiatal hernia is stable. No pleural effusion or pneumothorax.  Cardiac silhouette is mildly enlarged. No mediastinal or hilar masses or convincing adenopathy.  Bony thorax is demineralized. 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  IMPRESSION: 1. Opacity along the oblique fissure in the right middle lobe is new from the prior exam. This may be atelectasis. Pneumonia/pneumonitis is possible. 2. No other evidence of an acute abnormality. 3. Moderate hiatal hernia.  Mild cardiomegaly.   Electronically Signed   By: Amie Portland M.D.   On: 12/11/2014 15:54   Ct Head Wo Contrast  12/11/2014   CLINICAL DATA:  Recurrent falls at home.  EXAM: CT HEAD WITHOUT CONTRAST  TECHNIQUE: Contiguous axial images were obtained from the base of the skull through the vertex without intravenous contrast.  COMPARISON:  10/18/2014  FINDINGS: There is no evidence of intracranial hemorrhage, brain edema, or other signs of acute infarction. There is no evidence of intracranial mass  lesion or mass effect. No abnormal extraaxial fluid collections are identified.  Mild cerebral atrophy and chronic small vessel disease again demonstrated. Old lacunar infarcts involving the anterior limb of the right internal capsule and the left cerebellum are again demonstrated. Ventricles are stable in size. No skull abnormality identified.  IMPRESSION: No acute intracranial  abnormality.  Stable mild cerebral atrophy, chronic small vessel disease, and old lacunar infarcts.   Electronically Signed   By: Myles Rosenthal M.D.   On: 12/11/2014 15:45   ASSESSMENT AND PLAN:  Ebony Harper is a 81 y.o. female with a known history of coronary artery disease, severe dementia, hypothyroidism hypertension presents from [facility] secondary to an episode of aspiration resulting in hypoxia and difficulty breathing.  #1 Aspiration pneumonia-continue oxygen support. -Keep nothing by mouth and get a speech therapy consult. - Discussed with speech therapy who recommends formal speech evaluation by modified barium swallow - Continue Zosyn - Strict Aspiration precautions.  #2 Severe dementia-seems to be at baseline. -Does have significant behavioral disturbances and agitation episodes. But Easily oversedated with new medications and sedatives. -Continue risperidone 3 times a day and also low-dose Ativan when necessary which are her home medications.  #3 hypertension-continue Norvasc  #4 hyperlipidemia-on statin  #5 DVT prophylaxis-Lovenox  #6 acute on chronic renal failure- continue gentle hydration and recheck.  Physical therapy - Recommends short-term rehabilitation. social worker consult     All the records are reviewed and case discussed with Care Management/Social Worker. Management plans discussed with the patient, power of attorney and they are in agreement.  CODE STATUS: DO NOT RESUSCITATE  TOTAL TIME TAKING CARE OF THIS PATIENT: 35 minutes.   More than 50% of the time was spent in counseling/coordination of care: YES  POSSIBLE D/C IN 1-2 DAYS, DEPENDING ON CLINICAL CONDITION.   [facility], Henslee Lottman M.D on 12/12/2014 at 12:11 PM  Between 7am to 6pm - Pager - [phone]  After 6pm go to [email or web address] - password [email or web address]  [facility]  Office  [phone]  CC:  Primary care physician; Pcp Not In System

## 2014-12-12 NOTE — Progress Notes (Signed)
Patient is from [facility]F. PT is recommending SNF. Clinical Social Worker (CSW) attempted to meet with patient however she was asleep. CSW left 2 voicemails with her HPOA Burnard Bunting [phone] home and [phone] cell. CSW contacted Vilinda Boehringer the next contact on patient's face sheet. Per Tommi Emery is HPOA and will have to handle the D/C plan. CSW will continue to follow and assist as needed.   Jetta Lout, LCSWA [phone]

## 2014-12-12 NOTE — Progress Notes (Addendum)
Initial Nutrition Assessment  INTERVENTION:   Coordination of care: will follow poc pending MBSS tomorrow.   NUTRITION DIAGNOSIS:   Inadequate oral intake related to inability to eat as evidenced by NPO status.  GOAL:   Patient will meet greater than or equal to 90% of their needs  MONITOR:    (Energy Intake, Digestive System, Anthropometrics)  REASON FOR ASSESSMENT:   Diagnosis    ASSESSMENT:   Pt admitted with aspiration pna, SLP following. Pt scheduled for MBSS 12/13/2014. Unable to awaken pt on visit, however pt MD note pt with severe dementia.  Past Medical History  Diagnosis Date  . Dementia   . MI (myocardial infarction)   . Renal disorder   . Thyroid disease   . Hyperlipidemia   . Hypertension     Diet Order:  Diet NPO time specified    Current Nutrition: Pt NPO  Food/Nutrition-Related History: Unable to clarify as pt sleeping. Per MD note pt previously on regular diet without h/o dysphagia.   Medications: NS at 38mL/hr, ferrous sulfate, calcium-vitamin D  Electrolyte/Renal Profile and Glucose Profile:   Recent Labs Lab 12/11/14 1610 12/12/14 0356  NA 141 141  K 3.9 4.0  CL 104 106  CO2 27 25  BUN 28* 26*  CREATININE 1.63* 1.28*  CALCIUM 9.2 8.6*  GLUCOSE 112* 121*   Protein Profile:  Recent Labs Lab 12/11/14 1610  ALBUMIN 3.5    Gastrointestinal Profile: Last BM:  12/11/2014   Nutrition-Focused Physical Exam Findings:  Unable to complete Nutrition-Focused physical exam at this time.    Weight Change: Per [facility] weight gain in the past 2 months  Height:   Ht Readings from Last 1 Encounters:  12/11/14  (1.651 m)    Weight:   Wt Readings from Last 1 Encounters:  12/11/14 157 lb (71.215 kg)    Wt Readings from Last 10 Encounters:  12/11/14 157 lb (71.215 kg)  10/18/14 150 lb (68.04 kg)    BMI:  Body mass index is 27.82 kg/(m^2).  Estimated Nutritional Needs:   Kcal:  1545-1826kcals, BEE: 1170kcals, TEE: (IF  1.1-1.3)(AF 1.2)   Protein:  56-71g protein (0.8-1.0g/kg)  Fluid:  1775-2178mL of fluid (25-20mL/kg)  EDUCATION NEEDS:   No education needs identified at this time   HIGH Care Level   Leda Quail, RD, LDN Pager [phone]

## 2014-12-12 NOTE — Evaluation (Signed)
Physical Therapy Evaluation Patient Details Name: Ebony Harper MRN: [id number] DOB: 1931-04-18 Today's Date: 12/12/2014   History of Present Illness  Pt is an 81 y.o. female with episode of aspiration resulting in hypoxia and difficulty breathing at ALF and admitted to [facility] with aspiration PNA.  CT of head negative for acute intracranial abnormality.  PMH:  severe dementia with behavioral disturbances (gets agitated and confused often), CAD, htn, MI.  Clinical Impression  Currently pt demonstrates impairments with strength, balance, and limitations with functional mobility.  Prior to admission, pt was ambulating independently without AD at ALF.  Pt lives at [facility].  Currently pt is max assist supine to sit and min to mod assist with transfers and ambulating short distance bed to recliner.  Mobility complicated d/t cognitive status and difficulty following commands.  Pt would benefit from skilled PT to address above noted impairments and functional limitations.  Recommend pt discharge to STR when medically appropriate.  Anticipate pt would do better in familiar environment but currently concerned regarding posterior lean that pt is having difficulty correcting with vc's and tactile cues.  Will continue to monitor pt's progress with functional mobility and any changes to disposition recommendations.     Follow Up Recommendations SNF    Equipment Recommendations   (To be determined)    Recommendations for Other Services       Precautions / Restrictions Precautions Precautions: Fall Precaution Comments: Aspiration precautions Restrictions Weight Bearing Restrictions: No      Mobility  Bed Mobility Overal bed mobility: Needs Assistance Bed Mobility: Supine to Sit     Supine to sit: Max assist     General bed mobility comments: pt requiring step by step simple commands but pt still having difficulty following commands  Transfers Overall transfer level: Needs  assistance Equipment used: None Transfers: Sit to/from Stand Sit to Stand: Min assist;Mod assist         General transfer comment: pt requiring simple step by step commands and required vc's to shift weight forward d/t posterior lean  Ambulation/Gait Ambulation/Gait assistance: Min assist;Mod assist Ambulation Distance (Feet): 3 Feet Assistive device: None       General Gait Details: decreased B step length/foot clearance/heelstrike; posterior lean; pt "freezing" requiring vc's to get into chair safely  Stairs            Wheelchair Mobility    Modified Rankin (Stroke Patients Only)       Balance Overall balance assessment: Needs assistance Sitting-balance support: Bilateral upper extremity supported;Feet supported Sitting balance-Leahy Scale: Good     Standing balance support: No upper extremity supported;During functional activity Standing balance-Leahy Scale: Poor Standing balance comment: Posterior lean                             Pertinent Vitals/Pain Pain Assessment: No/denies pain  Pt initially 87% on 2 L/min via nasal cannula in bed beginning of session and nursing notified and requested to increase O2 to 3 L/min via nasal cannula.  After pt got into chair, pt's O2 noted to be 96% on 3 L/min via nasal cannula and O2 decreased back to 2 L/min via nasal cannula and O2 sats 95% end of session on 2 L/min via nasal cannula (nursing notified).  See flowsheet for rest of vital information.    Home Living Family/patient expects to be discharged to:: Assisted living [facility])  Home Equipment: None      Prior Function Level of Independence: Independent;Needs assistance      ADL's / Homemaking Assistance Needed: meals & medication assist  Comments: Pt was independent with ambulation without AD (pt has had 2 falls in past 2-3 months)     Hand Dominance        Extremity/Trunk Assessment   Upper Extremity  Assessment: Generalized weakness           Lower Extremity Assessment: Generalized weakness         Communication   Communication: No difficulties  Cognition Arousal/Alertness: Awake/alert Behavior During Therapy: Flat affect Overall Cognitive Status: History of cognitive impairments - at baseline (Pt oriented to self but not date/time and place)       Memory: Decreased short-term memory              General Comments   Nursing cleared pt for participation in physical therapy.  Pt agreeable to PT session. Pt's POA present for entire session.    Exercises  Pt educated on breathing technique to increase O2 sats (pt requiring extra time and simple vc's/demo for correct technique).      Assessment/Plan    PT Assessment Patient needs continued PT services  PT Diagnosis Difficulty walking;Generalized weakness   PT Problem List Decreased strength;Decreased activity tolerance;Decreased balance;Decreased mobility;Decreased knowledge of use of DME  PT Treatment Interventions DME instruction;Gait training;Functional mobility training;Therapeutic activities;Therapeutic exercise;Balance training;Patient/family education   PT Goals (Current goals can be found in the Care Plan section) Acute Rehab PT Goals Patient Stated Goal: To walk PT Goal Formulation: With patient Time For Goal Achievement: 12/26/14 Potential to Achieve Goals: Good    Frequency Min 2X/week   Barriers to discharge Decreased caregiver support      Co-evaluation               End of Session Equipment Utilized During Treatment: Gait belt;Oxygen Activity Tolerance: Patient tolerated treatment well Patient left: in chair;with call bell/phone within reach;with chair alarm set;with family/visitor present Nurse Communication: Mobility status (O2 sats)         Time: 1914-7829 PT Time Calculation (min) (ACUTE ONLY): 37 min   Charges:   PT Evaluation $Initial PT Evaluation Tier I: 1 Procedure PT  Treatments $Therapeutic Exercise: 8-22 mins   PT G CodesHendricks Limes 2014-12-26, 11:37 AM Hendricks Limes, PT [phone]

## 2014-12-12 NOTE — Clinical Social Work Placement (Signed)
   CLINICAL SOCIAL WORK PLACEMENT  NOTE  Date:  12/12/2014  Patient Details  Name: Ebony Harper MRN: [id number] Date of Birth: 19-Jan-1931  Clinical Social Work is seeking post-discharge placement for this patient at the Skilled  Nursing Facility level of care (*CSW will initial, date and re-position this form in  chart as items are completed):  Yes   Patient/family provided with [place] Clinical Social Work Department's list of facilities offering this level of care within the geographic area requested by the patient (or if unable, by the patient's family).  Yes   Patient/family informed of their freedom to choose among providers that offer the needed level of care, that participate in Medicare, Medicaid or managed care program needed by the patient, have an available bed and are willing to accept the patient.  Yes   Patient/family informed of 's ownership interest in [facility] and [facility], as well as of the fact that they are under no obligation to receive care at these facilities.  PASRR submitted to EDS on 12/12/14     PASRR number received on       Existing PASRR number confirmed on       FL2 transmitted to all facilities in geographic area requested by pt/family on 12/12/14     FL2 transmitted to all facilities within larger geographic area on       Patient informed that his/her managed care company has contracts with or will negotiate with certain facilities, including the following:            Patient/family informed of bed offers received.  Patient chooses bed at       Physician recommends and patient chooses bed at      Patient to be transferred to   on  .  Patient to be transferred to facility by       Patient family notified on   of transfer.  Name of family member notified:        PHYSICIAN Please sign FL2     Additional Comment:    _______________________________________________ Haig Prophet, LCSW 12/12/2014, 4:49 PM

## 2014-12-12 NOTE — Progress Notes (Signed)
ANTIBIOTIC CONSULT NOTE - INITIAL  Pharmacy Consult for Unasyn  Indication: Aspiration Pneumonia   Allergies  Allergen Reactions  . Strawberry     Patient Measurements: Height:  (pt and family unsure) Weight: 157 lb (71.215 kg) IBW/kg (Calculated) : 57   Vital Signs: Temp: 98.2 F (36.8 C) (08/22 0753) Temp Source: Oral (08/22 0416) BP: 147/50 mmHg (08/22 0941) Pulse Rate: 57 (08/22 1400) Intake/Output from previous day:   Intake/Output from this shift:    Labs:  Recent Labs  12/11/14 1610 12/12/14 0356  WBC 12.1* 22.3*  HGB 12.1 12.0  PLT 252 224  CREATININE 1.63* 1.28*   Estimated Creatinine Clearance: 33.5 mL/min (by C-G formula based on Cr of 1.28). No results for input(s): VANCOTROUGH, VANCOPEAK, VANCORANDOM, GENTTROUGH, GENTPEAK, GENTRANDOM, TOBRATROUGH, TOBRAPEAK, TOBRARND, AMIKACINPEAK, AMIKACINTROU, AMIKACIN in the last 72 hours.   Microbiology: Recent Results (from the past 720 hour(s))  Blood culture (routine x 2)     Status: None (Preliminary result)   Collection Time: 12/11/14  5:52 PM  Result Value Ref Range Status   Specimen Description BLOOD RIGHT ASSIST CONTROL  Final   Special Requests   Final    BOTTLES DRAWN AEROBIC AND ANAEROBIC  AER 3CC ANA 1CC   Culture NO GROWTH < 24 HOURS  Final   Report Status PENDING  Incomplete  Blood culture (routine x 2)     Status: None (Preliminary result)   Collection Time: 12/11/14  6:04 PM  Result Value Ref Range Status   Specimen Description BLOOD LEFT HAND  Final   Special Requests BOTTLES DRAWN AEROBIC AND ANAEROBIC  1CC  Final   Culture NO GROWTH < 24 HOURS  Final   Report Status PENDING  Incomplete    Medical History: Past Medical History  Diagnosis Date  . Dementia   . MI (myocardial infarction)   . Renal disorder   . Thyroid disease   . Hyperlipidemia   . Hypertension     Medications:  Scheduled:  . amLODipine  10 mg Oral BID  . ampicillin-sulbactam (UNASYN) IV  3 g Intravenous Q6H  .  aspirin  81 mg Oral Daily  . atorvastatin  40 mg Oral QHS  . calcium-vitamin D  1 tablet Oral BID  . diclofenac sodium  2 g Topical TID  . enoxaparin (LOVENOX) injection  40 mg Subcutaneous Q24H  . ferrous sulfate  325 mg Oral Daily  . risperiDONE  1 mg Oral TID  . sertraline  25 mg Oral Daily  . traMADol  25 mg Oral BID   Infusions:  . sodium chloride 50 mL/hr at 12/12/14 0246   Assessment: 81 yo female started on Unasyn to cover aspiration Pneumonia. Patient initially started on Zosyn.    Plan:  Will start patient on Unasyn 3g IV Q6hr. Will continue to follow serum creatinine/creatinine clearance and adjust as appropriate.   Simpson,Michael L 12/12/2014,3:12 PM

## 2014-12-12 NOTE — Progress Notes (Signed)
Notified Dr. Sherryll Burger via telephone that pt is brady on tele with heart rate dropping as low as 20's while asleep, pt is assymptomatic, per poa heart rate drops low when patient is asleep, MD acknowledged.

## 2014-12-12 NOTE — Evaluation (Signed)
Clinical/Bedside Swallow Evaluation Patient Details  Name: Ebony Harper MRN: [id number] Date of Birth: 1930-04-23  Today's Date: 12/12/2014 Time: SLP Start Time (ACUTE ONLY): 0950 SLP Stop Time (ACUTE ONLY): 1035 SLP Time Calculation (min) (ACUTE ONLY): 45 min  Past Medical History:  Past Medical History  Diagnosis Date  . Dementia   . MI (myocardial infarction)   . Renal disorder   . Thyroid disease   . Hyperlipidemia   . Hypertension    Past Surgical History:  Past Surgical History  Procedure Laterality Date  . Cataract Bilateral    HPI:  Ebony Harper is a 82 y.o. female with a known history of coronary artery disease, severe dementia, hypothyroidism hypertension presents from [facility] secondary to an episode of aspiration resulting in hypoxia and difficulty breathing. Patient is severely demented so most of the history is obtained from the ER physician and also patient's power of attorney at bedside. Patient has been living at [facility] for almost a year now. POA reported Ebony Harper has had "3" episodes of Pneumonia of which "they treated there". She is mostly independent and ambulatory at baseline but is very demented with behavioral disturbances. She gets agitated and confused often. Ebony Harper arrived by EMS from [facility]. According to staff, patient has been coughing up flem x1 week and has rattling in lungs. Concern for aspiration. EXAM: CHEST 2 VIEW COMPARISON: 10/18/2014 FINDINGS: There is opacity in the right middle lobe along the oblique fissure that is likely atelectasis. Pneumonia or pneumonitis is possible. Lungs are mildly hyperexpanded but otherwise clear. Moderate size hiatal hernia is stable. Ebony Harper required verbal cues and assistance to follow along w/ tasks and feed self. Minimally verbal w/ SLP to direct questions.    Assessment / Plan / Recommendation Clinical Impression  Ebony Harper at high risk for aspiration; significant pharyngeal phase  deficits noted during swallowing attempts w/ 2 trials of thin liquids via cup. Ebony Harper exhibited increased pharyngeal/laryngeal secretions and difficulty clearing these w/out assistance by SLP and RT requested to room. (Ebony Harper w/ moderate secretions noted.) No further po trials rec'd. MD consulted and agreed w/ potential need for MBSS next 1-2 days to further assess swallow function sec. to presentation today and Ebony Harper's h/o pneumonias per POA. NSG updated on oral care recommendation. POA/Ebony Harper agreed.     Aspiration Risk  Severe    Diet Recommendation NPO   Medication Administration: Via alternative means (IV)    Other  Recommendations Recommended Consults:  (TBD; Dietician consult) Oral Care Recommendations: Oral care QID Other Recommendations:  (TBD)   Follow Up Recommendations       Frequency and Duration min 3x week  1 week   Pertinent Vitals/Pain denied    SLP Swallow Goals  see care plan   Swallow Study Prior Functional Status   resides at AL at [facility]; assistance w/ ADLs. Eating a regular diet. Baseline Cognitive decline.     General Date of Onset: 12/11/14 Other Pertinent Information: Ebony Harper is a 81 y.o. female with a known history of coronary artery disease, severe dementia, hypothyroidism hypertension presents from [facility] secondary to an episode of aspiration resulting in hypoxia and difficulty breathing. Patient is severely demented so most of the history is obtained from the ER physician and also patient's power of attorney at bedside. Patient has been living at [facility] assisted[facility]living facility for almost a year now. POA reported Ebony Harper has had "3" episodes of Pneumonia of which "they treated there". She  is mostly independent and ambulatory at baseline but is very demented with behavioral disturbances. She gets agitated and confused often. Ebony Harper arrived by EMS from [facility]. According to staff, patient has been coughing up flem x1 week and has rattling  in lungs. Concern for aspiration. EXAM: CHEST 2 VIEW COMPARISON: 10/18/2014 FINDINGS: There is opacity in the right middle lobe along the oblique fissure that is likely atelectasis. Pneumonia or pneumonitis is possible. Lungs are mildly hyperexpanded but otherwise clear. Moderate size hiatal hernia is stable. Ebony Harper required verbal cues and assistance to follow along w/ tasks and feed self. Minimally verbal w/ SLP to direct questions.  Type of Study: Bedside swallow evaluation Previous Swallow Assessment: none Diet Prior to this Study: Regular;Thin liquids Temperature Spikes Noted: No (WBC elevated 12.1; 22.3 today) Respiratory Status: Supplemental O2 delivered via (comment) (2 liters) History of Recent Intubation: No Behavior/Cognition: Alert;Confused;Distractible;Requires cueing;Doesn't follow directions Oral Cavity - Dentition: Adequate natural dentition/normal for age Self-Feeding Abilities: Needs assist;Total assist (assisted in holding cup w/ SLP) Patient Positioning: Upright in bed Baseline Vocal Quality: Low vocal intensity Volitional Cough: Strong Volitional Swallow: Able to elicit    Oral/Motor/Sensory Function Overall Oral Motor/Sensory Function:  (unable to follow through w/ tasks; adequate to take po's)   Ice Chips Ice chips: Within functional limits Presentation:  (fed; 3 trials) Other Comments: min. slower oral movements but Ebony Harper appeared distracted   Thin Liquid Thin Liquid: Impaired Presentation: Cup;Self Fed (assisted in holding cup by SLP; small, single sips each) Oral Phase Impairments:  (none) Oral Phase Functional Implications:  (none) Pharyngeal  Phase Impairments: Multiple swallows;Decreased hyoid-laryngeal movement;Wet Vocal Quality;Cough - Immediate (change in respiratory pattern) Other Comments: 2 trials given; overt choking on water and secretions noted w/ 2nd trial. Nothing further given.     Nectar Thick Nectar Thick Liquid: Not tested   Honey Thick Honey  Thick Liquid: Not tested   Puree Puree: Not tested   Solid   GO    Solid: Not tested      Jerilynn Som, MS, CCC-SLP  Neima Lacross 12/12/2014,3:51 PM

## 2014-12-13 ENCOUNTER — Inpatient Hospital Stay: Payer: Medicare Other

## 2014-12-13 NOTE — Evaluation (Signed)
Objective Swallowing Evaluation: Other (Comment) (MBSS)  Patient Details  Name: Ebony Harper MRN: [id number] Date of Birth: Apr 08, 1931  Today's Date: 12/13/2014 Time: SLP Start Time (ACUTE ONLY): 1100-SLP Stop Time (ACUTE ONLY): 1200 SLP Time Calculation (min) (ACUTE ONLY): 60 min  Past Medical History:  Past Medical History  Diagnosis Date  . Dementia   . MI (myocardial infarction)   . Renal disorder   . Thyroid disease   . Hyperlipidemia   . Hypertension    Past Surgical History:  Past Surgical History  Procedure Laterality Date  . Cataract Bilateral    HPI:  Other Pertinent Information: Pt is a 81 y.o. female with a known history of coronary artery disease, severe dementia, hypothyroidism hypertension presents from [facility] secondary to an episode of aspiration resulting in hypoxia and difficulty breathing. Patient is severely demented so most of the history is obtained from the ER physician and also patient's power of attorney at bedside. Patient has been living at [facility] for almost a year now. POA reported pt has had "3" episodes of Pneumonia of which "they treated there". She is mostly independent and ambulatory at baseline but is very demented with behavioral disturbances. She gets agitated and confused often. Pt arrived by EMS from [facility]. According to staff, patient has been coughing up flem x1 week and has rattling in lungs. Concern for aspiration. EXAM: CHEST 2 VIEW COMPARISON: 10/18/2014 FINDINGS: There is opacity in the right middle lobe along the oblique fissure that is likely atelectasis. Pneumonia or pneumonitis is possible. Lungs are mildly hyperexpanded but otherwise clear. Moderate size hiatal hernia is stable. Pt required verbal cues and assistance to follow along w/ tasks and feed self. Minimally verbal w/ SLP to direct questions.   No Data Recorded  Assessment / Plan / Recommendation CHL IP  CLINICAL IMPRESSIONS 12/13/2014  Therapy Diagnosis Suspected primary esophageal dysphagia  Clinical Impression Pt presented w/ severe Esophageal phase dysphagia. The result of the poor bolus motility and Esophageal emptying was that Liquid material pt consumed appeared to remain throughout the entire Esophagus w/ significant Esophageal distention. Retrograde activity of the bolus material was noted proximal to the pharynx and then spilling into the larynx followed by aspiration. This presentation occurred after only a few swallows of thin liquids. Pt's oropharyngeal phase of swallowing appeared grossly wfl w/ adequate pharyngeal timing of the swallow and adequate oral clearing after the swallow. Pt does not appear safe for any oral intake d/t the degree of Severe Esopahgeal Phase Dysphagia.       CHL IP TREATMENT RECOMMENDATION 12/13/2014  Treatment Recommendations (No Data)     CHL IP DIET RECOMMENDATION 12/13/2014  SLP Diet Recommendations NPO  Liquid Administration via (None)  Medication Administration Via alternative means  Compensations (None)  Postural Changes and/or Swallow Maneuvers (None)     CHL IP OTHER RECOMMENDATIONS 12/13/2014  Recommended Consults Consider GI evaluation;Consider esophageal assessment  Oral Care Recommendations Oral care QID  Other Recommendations (None)     No flowsheet data found.   CHL IP FREQUENCY AND DURATION 12/13/2014  Speech Therapy Frequency (ACUTE ONLY) min 3x week  Treatment Duration 1 week     Pertinent Vitals/Pain denied    SLP Swallow Goals No flowsheet data found.  No flowsheet data found.    CHL IP REASON FOR REFERRAL 12/13/2014  Reason for Referral Objectively evaluate swallowing function     No flowsheet data found.    No flowsheet data found.  CHL IP CERVICAL ESOPHAGEAL PHASE 12/13/2014  Cervical Esophageal Phase Impaired  Pudding Teaspoon (None)  Pudding Cup (None)  Honey Teaspoon (None)  Honey Cup (None)  Honey Straw (None)   Nectar Teaspoon (None)  Nectar Cup (None)  Nectar Straw (None)  Nectar Sippy Cup (None)  Thin Teaspoon (None)  Thin Cup Esophageal backflow into cervical esophagus;Esophageal backflow into the pharynx;Esophageal backflow into the larynx  Thin Straw (None)  Thin Sippy Cup (None)  Cervical Esophageal Comment Liquid material pt consumed appeared to remain throughout the entire Esophagus and did not appear to empty; the Esophagus remained distended. Retrograde activity of the bolus material was noted into the pharynx and then spilled into the larynx and was aspirated. This presentation occurred after only a few swallows of thin liquids.     No flowsheet data found.        Ebony Som, MS, CCC-SLP  Harper,Ebony 12/13/2014, 4:22 PM

## 2014-12-13 NOTE — Progress Notes (Addendum)
[facility] [place] [facility]   PATIENT NAME: Ebony Harper    MR#:  [id number]  DATE OF BIRTH:  November 24, 1930  SUBJECTIVE:  CHIEF COMPLAINT:   Chief Complaint  Patient presents with  . Aspiration  About same. ST EVAL showed esophageal stricture  REVIEW OF SYSTEMS:  Review of Systems  Constitutional: Negative for fever, weight loss, malaise/fatigue and diaphoresis.  HENT: Negative for ear discharge, ear pain, hearing loss, nosebleeds, sore throat and tinnitus.   Eyes: Negative for blurred vision and pain.  Respiratory: Negative for cough, hemoptysis, shortness of breath and wheezing.   Cardiovascular: Negative for chest pain, palpitations, orthopnea and leg swelling.  Gastrointestinal: Negative for heartburn, nausea, vomiting, abdominal pain, diarrhea, constipation and blood in stool.  Genitourinary: Negative for dysuria, urgency and frequency.  Musculoskeletal: Negative for myalgias and back pain.  Skin: Negative for itching and rash.  Neurological: Positive for weakness. Negative for dizziness, tingling, tremors, focal weakness, seizures and headaches.  Psychiatric/Behavioral: Negative for depression. The patient is not nervous/anxious.    DRUG ALLERGIES:   Allergies  Allergen Reactions  . Strawberry    VITALS:  Blood pressure 142/44, pulse 66, temperature 98.1 F (36.7 C), temperature source Oral, resp. rate 16, weight 71.215 kg (157 lb), SpO2 97 %. PHYSICAL EXAMINATION:  Physical Exam  Constitutional: She appears lethargic. She appears unhealthy.  HENT:  Head: Normocephalic and atraumatic.  Eyes: Conjunctivae and EOM are normal. Pupils are equal, round, and reactive to light.  Neck: Normal range of motion. Neck supple. No tracheal deviation present. No thyromegaly present.  Cardiovascular: Normal rate, regular rhythm and normal heart sounds.   Pulmonary/Chest: Effort normal. No respiratory distress. She has no wheezes. She has rhonchi. She  exhibits no tenderness.  Abdominal: Soft. Bowel sounds are normal. She exhibits no distension. There is no tenderness.  Musculoskeletal: Normal range of motion.  Neurological: She appears lethargic. She displays weakness. No cranial nerve deficit.  lethargic  Skin: Skin is warm and dry. No rash noted.  Psychiatric: Mood and affect normal.   LABORATORY PANEL:   CBC  Recent Labs Lab 12/12/14 0356  WBC 22.3*  HGB 12.0  HCT 37.8  PLT 224   ------------------------------------------------------------------------------------------------------------------ Chemistries   Recent Labs Lab 12/11/14 1610 12/12/14 0356  NA 141 141  K 3.9 4.0  CL 104 106  CO2 27 25  GLUCOSE 112* 121*  BUN 28* 26*  CREATININE 1.63* 1.28*  CALCIUM 9.2 8.6*  AST 21  --   ALT 12*  --   ALKPHOS 75  --   BILITOT 0.2*  --    RADIOLOGY:  Dg Chest 2 View  12/13/2014   CLINICAL DATA:  Chest pain.  EXAM: CHEST  2 VIEW  COMPARISON:  12/11/2014.  10/18/2014.  FINDINGS: Mediastinum and hilar structures normal. Cardiomegaly with normal pulmonary vascularity. Progressive right base atelectasis and/or infiltrate. Progressive small right pleural effusion. Left base subsegmental atelectasis. Old posttraumatic changes noted of the left proximal humerus. Diffuse osteopenia .  IMPRESSION: 1. Progressive mild atelectasis and/or infiltrate right lower lobe with progressive small right pleural effusion. Left base subsegmental atelectasis. 2. Cardiomegaly.  No evidence of pulmonary venous congestion.   Electronically Signed   By: Maisie Fus  Register   On: 12/13/2014 11:59   ASSESSMENT AND PLAN:  Tyshawna Alarid is a 81 y.o. female with a known history of coronary artery disease, severe dementia, hypothyroidism hypertension presents from [facility] secondary to an episode of aspiration resulting in  hypoxia and difficulty breathing.  #1 Aspiration pneumonia-continue oxygen support. -Keep nothing by mouth  and get a speech therapy consult. - Discussed with speech therapy who recommends formal speech evaluation by modified barium swallow - Continue Zosyn - Strict Aspiration precautions.  #2 Severe dementia-seems to be at baseline. -Does have significant behavioral disturbances and agitation episodes. But Easily oversedated with new medications and sedatives. -Continue risperidone 3 times a day and also low-dose Ativan when necessary which are her home medications.  #3 hypertension-continue Norvasc  #4 hyperlipidemia-on statin  #5 Esophageal stricture: Per Santina Evans from speech, will consult GI for further evaluation  #6 acute on chronic renal failure- continue gentle hydration and recheck.  Physical therapy - Recommends short-term rehabilitation , but family does not want that.  They are rather have her evaluated for hospice and go back to [place], which with hospice if possible.  I have discussed with social worker     All the records are reviewed and case discussed with Care Management/Social Worker. Management plans discussed with the patient, power of attorney and they are in agreement.  CODE STATUS: DO NOT RESUSCITATE  TOTAL TIME TAKING CARE OF THIS PATIENT: 35 minutes.   More than 50% of the time was spent in counseling/coordination of care: YES  POSSIBLE D/C IN 1-2 DAYS, DEPENDING ON CLINICAL CONDITION.   [facility], Olly Shiner M.D on 12/13/2014 at 4:04 PM  Between 7am to 6pm - Pager - [phone]  After 6pm go to [email or web address] - password [email or web address]  [facility]  Office  [phone]  CC:  Primary care physician; Pcp Not In System

## 2014-12-13 NOTE — Clinical Social Work Note (Signed)
Clinical Social Work Assessment  Patient Details  Name: Ebony Harper MRN: [id number] Date of Birth: Aug 29, 1930  Date of referral:  12/13/14               Reason for consult:  Facility Placement, Other (Comment Required) (Hospice Patient )                Permission sought to share information with:  Facility Industrial/product designer granted to share information::     Name::      [facility]::   Assisted Living Facility   Relationship::     Contact Information:     Housing/Transportation Living arrangements for the past 2 months:  Assisted Dealer of Information:  Power of Attorney Patient Interpreter Needed:  None Criminal Activity/Legal Involvement Pertinent to Current Situation/Hospitalization:  No - Comment as needed Significant Relationships:  Friend Lives with:  Facility Resident Do you feel safe going back to the place where you live?  Yes Need for family participation in patient care:  Yes (Comment)  Care giving concerns: Patient is a resident at [facility].    Social Worker assessment / plan: Visual merchandiser (CSW) contacted patient's friend and HPOA [facility] [phone]. Per [place] Sink patient is her best friend and they retired together along with [employer]. Kathie Rhodes is also listed as an emergency contact on patient's face sheet. Per [facility] she is HPOA. [facility] reported that patient lives at [facility] and she would like for her to return with hospice services. [facility] reported that she does not want rehab or SNF. Per Dr. Sherryll Burger patient is appropriate for hospice services and asked CSW to do hospice screen. CSW provided hospice choice to [place]. [place] Sink chose [facility]. Per [place] Sink patient was followed by [facility] several months ago and they D/C'ed her. CSW contacted Nurse, learning disability of Memory Care at [facility]. Per Shanda Bumps patient can return to [facility] with hospice following if she is not NPO and does not have any  IVs. Per Shanda Bumps a [facility] employee will assess patient this afternoon to see of she can return to Memory Care or go to the [facility] hall at [facility], which is for hospice patients. CSW also contacted [facility] liaison. Per Clydie Braun patient was under palliative care with [facility]. Per Clydie Braun she will meet with [facility] to go over Hospice services at [facility].   FL2 updated and on chart. CSW will continue to follow and assist as needed.   Employment status:  Retired Health and safety inspector:  Medicare PT Recommendations:  Skilled Nursing Facility Information / Referral to community resources:  Other (Comment Required) (ALF with Hospice )  Patient/Family's Response to care: Patient's HPOA Emery Sink would like for patient to return to [facility] ALF with [facility] following.   Patient/Family's Understanding of and Emotional Response to Diagnosis, Current Treatment, and Prognosis:  Sink was pleasant and thanked CSW for visit.   Emotional Assessment Appearance:  Appears older than stated age Attitude/Demeanor/Rapport:  Unable to Assess Affect (typically observed):  Unable to Assess Orientation:  Fluctuating Orientation (Suspected and/or reported Sundowners) Alcohol / Substance use:  Not Applicable Psych involvement (Current and /or in the community):  No (Comment)  Discharge Needs  Concerns to be addressed:  Discharge Planning Concerns Readmission within the last 30 days:  No Current discharge risk:  Chronically ill, Cognitively Impaired Barriers to Discharge:  Continued Medical Work up   Haig Prophet, LCSW 12/13/2014, 2:07 PM

## 2014-12-13 NOTE — Progress Notes (Signed)
MD has ordered GI consult. The outcome of the GI consult will determine plan of care and hospice eligibility. Clinical Social Worker (CSW) will continue to follow and assist as needed.   Jetta Lout, LCSWA [phone]

## 2014-12-13 NOTE — Progress Notes (Signed)
PT Cancellation Note  Patient Details Name: Naomia Lenderman MRN: [id number] DOB: [birth year]   Cancelled Treatment:    Reason Eval/Treat Not Completed: Patient at procedure or test/unavailable (Pt leaving for imaging off floor.)  Will re-attempt PT treatment at a later date/time.   Hendricks Limes 12/13/2014, 11:37 AM Hendricks Limes, PT [phone]

## 2014-12-13 NOTE — Care Management Important Message (Signed)
Important Message  Patient Details  Name: Ebony Harper MRN: [id number] Date of Birth: November 06, 1930   Medicare Important Message Given:  Yes-second notification given    Verita Schneiders [place] 12/13/2014, 9:40 AM

## 2014-12-13 NOTE — Consult Note (Signed)
GI Inpatient Consult Note Christena Deem MD  Reason for Consult: Possible esophageal stricture   Attending Requesting Consult: Dr. Delfino Lovett  Outpatient Primary Physician: Dr. York Cerise  History of Present Illness: Ebony Harper is a 81 y.o. female admitted to the hospital on 10/11/2014 due to findings of hypoxia, difficulty breathing and likely aspiration pneumonia. Patient has a severe dementia and is unable to give accurate history. POA is not present currently.  Patient has had evaluation by speech pathology. She'll evaluation gave the impression of a significant pharyngeal phase deficit liver modified barium swallow done earlier today showed a severe esophageal phase dysphagia. This would infer possible esophageal stricture.  Chest x-ray shows a right lower lobe infiltrate concerning for pneumonia, WBCs have been increasing over the course of the hospitalization with a white count of 22.3 today.  Past Medical History:  Past Medical History  Diagnosis Date  . Dementia   . MI (myocardial infarction)   . Renal disorder   . Thyroid disease   . Hyperlipidemia   . Hypertension     Problem List: Patient Active Problem List   Diagnosis Date Noted  . Aspiration pneumonia 12/11/2014    Past Surgical History: Past Surgical History  Procedure Laterality Date  . Cataract Bilateral     Allergies: Allergies  Allergen Reactions  . Strawberry     Home Medications: Prescriptions prior to admission  Medication Sig Dispense Refill Last Dose  . acetaminophen (TYLENOL) 500 MG tablet Take 1,000 mg by mouth 3 (three) times daily.   12/11/2014 at Unknown time  . amLODipine (NORVASC) 10 MG tablet Take 10 mg by mouth 2 (two) times daily.    12/11/2014 at Unknown time  . aspirin 81 MG chewable tablet Chew 81 mg by mouth daily.   12/11/2014 at Unknown time  . atorvastatin (LIPITOR) 40 MG tablet Take 40 mg by mouth at bedtime.   12/10/2014 at Unknown time  . benzonatate (TESSALON) 100 MG capsule  Take 100 mg by mouth every 8 (eight) hours as needed for cough.   prn  . Calcium Carbonate-Vitamin D (CALCIUM 600-D) 600-400 MG-UNIT per tablet Take 1 tablet by mouth 2 (two) times daily.   12/11/2014 at Unknown time  . diclofenac sodium (VOLTAREN) 1 % GEL Apply 2 g topically 3 (three) times daily.   12/11/2014 at Unknown time  . diphenhydrAMINE (BENADRYL) 25 mg capsule Take 25 mg by mouth every 2 (two) hours as needed.   prn  . docusate sodium (COLACE) 100 MG capsule Take 100 mg by mouth 2 (two) times daily.   12/11/2014 at Unknown time  . ferrous sulfate 325 (65 FE) MG tablet Take 325 mg by mouth daily.   12/11/2014 at Unknown time  . LORazepam (ATIVAN) 0.5 MG tablet Take 0.5 mg by mouth every 8 (eight) hours as needed (agitation).   Past Month at Unknown time  . nitroGLYCERIN (NITROSTAT) 0.4 MG SL tablet Place 0.4 mg under the tongue every 5 (five) minutes as needed for chest pain.   unknown  . risperiDONE (RISPERDAL) 1 MG tablet Take 1 mg by mouth 3 (three) times daily.   12/11/2014 at Unknown time  . senna-docusate (SENOKOT-S) 8.6-50 MG per tablet Take 1 tablet by mouth at bedtime.   12/10/2014 at Unknown time  . sertraline (ZOLOFT) 25 MG tablet Take 25 mg by mouth daily.   12/11/2014 at Unknown time  . traMADol (ULTRAM) 50 MG tablet Take 25 mg by mouth 2 (two) times daily.   12/11/2014  at Unknown time  . traMADol (ULTRAM) 50 MG tablet Take 1 tablet (50 mg total) by mouth every 6 (six) hours as needed. 20 tablet 0 prn   Home medication reconciliation was completed with the patient.   Scheduled Inpatient Medications:   . amLODipine  10 mg Oral BID  . ampicillin-sulbactam (UNASYN) IV  3 g Intravenous Q6H  . aspirin  81 mg Oral Daily  . atorvastatin  40 mg Oral QHS  . calcium-vitamin D  1 tablet Oral BID  . diclofenac sodium  2 g Topical TID  . enoxaparin (LOVENOX) injection  40 mg Subcutaneous Q24H  . ferrous sulfate  325 mg Oral Daily  . risperiDONE  1 mg Oral TID  . sertraline  25 mg Oral  Daily  . traMADol  25 mg Oral BID    Continuous Inpatient Infusions:   . sodium chloride 50 mL/hr at 12/12/14 2154    PRN Inpatient Medications:  acetaminophen **OR** acetaminophen, albuterol, benzonatate, diphenhydrAMINE, docusate sodium, LORazepam, nitroGLYCERIN, senna, traMADol  Family History: family history includes CAD in her mother.   GI Family History: Unable to obtain  Social History:   reports that she quit smoking about 3 years ago. Her smoking use included Cigarettes. She has never used smokeless tobacco. She reports that she does not drink alcohol or use illicit drugs. The patient denies ETOH, tobacco, or drug use.   ROS  Review of Systems: 11 systems reviewed per admission history and physical, agree with same  Physical Examination: BP 152/61 mmHg  Pulse 70  Temp(Src) 100 F (37.8 C) (Axillary)  Resp 18  Ht 5\' 5"  (1.651 m)  Wt 71.215 kg (157 lb)  BMI 26.13 kg/m2  SpO2 91% Gen: Elderly-appearing 81 year old female no acute distress. Unable to give answers to questions due to her advancing dementia HEENT: Normocephalic atraumatic eyes are anicteric Neck: No JVD Chest: Decreased breath sounds left base more so than right base CV: Regular rate and rhythm Abd: Soft nontender nondistended bowel sounds positive normoactive Ext: No clubbing cyanosis or edema Skin: Other:  Data: Lab Results  Component Value Date   WBC 22.3* 12/12/2014   HGB 12.0 12/12/2014   HCT 37.8 12/12/2014   MCV 93.7 12/12/2014   PLT 224 12/12/2014    Recent Labs Lab 12/11/14 1610 12/12/14 0356  HGB 12.1 12.0   Lab Results  Component Value Date   NA 141 12/12/2014   K 4.0 12/12/2014   CL 106 12/12/2014   CO2 25 12/12/2014   BUN 26* 12/12/2014   CREATININE 1.28* 12/12/2014   Lab Results  Component Value Date   ALT 12* 12/11/2014   AST 21 12/11/2014   ALKPHOS 75 12/11/2014   BILITOT 0.2* 12/11/2014   No results for input(s): APTT, INR, PTT in the last 168 hours. CBC  Latest Ref Rng 12/12/2014 12/11/2014 10/18/2014  WBC 3.6 - 11.0 K/uL 22.3(H) 12.1(H) 13.0(H)  Hemoglobin 12.0 - 16.0 g/dL 16.1 09.6 10.2(L)  Hematocrit 35.0 - 47.0 % 37.8 38.3 31.6(L)  Platelets 150 - 440 K/uL 224 252 241    STUDIES: Dg Chest 2 View  12/13/2014   CLINICAL DATA:  Chest pain.  EXAM: CHEST  2 VIEW  COMPARISON:  12/11/2014.  10/18/2014.  FINDINGS: Mediastinum and hilar structures normal. Cardiomegaly with normal pulmonary vascularity. Progressive right base atelectasis and/or infiltrate. Progressive small right pleural effusion. Left base subsegmental atelectasis. Old posttraumatic changes noted of the left proximal humerus. Diffuse osteopenia .  IMPRESSION: 1. Progressive mild atelectasis and/or infiltrate  right lower lobe with progressive small right pleural effusion. Left base subsegmental atelectasis. 2. Cardiomegaly.  No evidence of pulmonary venous congestion.   Electronically Signed   By: Maisie Fus  Register   On: 12/13/2014 11:59   @IMAGES @  Assessment: 1. History of likely recurrent aspiration pneumonia 2. Abnormal barium swallow study. This could indicate a esophageal stricture however differential diagnosis would include esophageal neoplasia, achalasia, peptic stricture, retained foreign-body, extrinsic compression.  Recommendations: 1. Agree with luminal evaluation. Early however patient has increasing white count with fever. We'll need to discuss further with speech pathology and internal medicine. There was barium used for the speech evaluation and if there is a high-grade stricture or other lesion preventing [place] from the evacuating the esophagus, procedure will be quite difficult with barium in place. Hopefully I will be able to do the procedure tomorrow afternoon however I will also need to discuss further with POA.  Thank you for the consult. Please call with questions or concerns.  Christena Deem, MD  12/13/2014 10:31 PM

## 2014-12-14 ENCOUNTER — Inpatient Hospital Stay: Payer: Medicare Other

## 2014-12-14 LAB — CBC
HCT: 35.1 % (ref 35.0–47.0)
HEMOGLOBIN: 11.1 g/dL — AB (ref 12.0–16.0)
MCH: 29.5 pg (ref 26.0–34.0)
MCHC: 31.8 g/dL — AB (ref 32.0–36.0)
MCV: 92.8 fL (ref 80.0–100.0)
Platelets: 207 10*3/uL (ref 150–440)
RBC: 3.78 MIL/uL — AB (ref 3.80–5.20)
RDW: 15.1 % — ABNORMAL HIGH (ref 11.5–14.5)
WBC: 12.5 10*3/uL — ABNORMAL HIGH (ref 3.6–11.0)

## 2014-12-14 LAB — BASIC METABOLIC PANEL
Anion gap: 8 (ref 5–15)
BUN: 20 mg/dL (ref 6–20)
CHLORIDE: 114 mmol/L — AB (ref 101–111)
CO2: 24 mmol/L (ref 22–32)
CREATININE: 1.14 mg/dL — AB (ref 0.44–1.00)
Calcium: 7.9 mg/dL — ABNORMAL LOW (ref 8.9–10.3)
GFR calc Af Amer: 50 mL/min — ABNORMAL LOW (ref >60)
GFR calc non Af Amer: 44 mL/min — ABNORMAL LOW (ref >60)
GLUCOSE: 85 mg/dL (ref 65–99)
POTASSIUM: 3.5 mmol/L (ref 3.5–5.1)
SODIUM: 146 mmol/L — AB (ref 135–145)

## 2014-12-14 LAB — EXPECTORATED SPUTUM ASSESSMENT W REFEX TO RESP CULTURE

## 2014-12-14 LAB — EXPECTORATED SPUTUM ASSESSMENT W GRAM STAIN, RFLX TO RESP C

## 2014-12-14 MED ORDER — MORPHINE SULFATE (CONCENTRATE) 10 MG/0.5ML PO SOLN
10.0000 mg | ORAL | Status: DC | PRN
  Administered 2014-12-14 – 2014-12-15 (×2): 10 mg via SUBLINGUAL
  Filled 2014-12-14 (×2): qty 0.5

## 2014-12-14 MED ORDER — DEXTROSE-NACL 5-0.9 % IV SOLN
INTRAVENOUS | Status: DC
  Administered 2014-12-14: 13:00:00 via INTRAVENOUS

## 2014-12-14 NOTE — Progress Notes (Signed)
PT Cancellation Note  Patient Details Name: Ebony Harper MRN: [id number] DOB: 1931-04-14   Cancelled Treatment:    Reason Eval/Treat Not Completed: Medical issues which prohibited therapy (Nursing recommending holding PT (nursing currently suctioning pt and pt with decreased O2 sats on supplemental O2)).  Will re-attempt PT treatment at a later date/time.   Irving Burton Kazzandra Desaulniers 12/14/2014, 10:50 AM Hendricks Limes, PT [phone]

## 2014-12-14 NOTE — Progress Notes (Signed)
Northwood Sink removed patients jewerly and took with her since patients hands were starting to swell.  Patient expected to discharge to [facility] tomorrow.  Comfort Care measures only.

## 2014-12-14 NOTE — Progress Notes (Signed)
New referral for [facility] after discharge received from CSW [employer]. Writer spoke briefly to patient's Ebony Harper on 8/23 and it was decided that writer would return based on outcome of GI consult. Patient had an episode of increased secretions this morning that required nasosuctioning by respiratorytherapy with decreased oxygen saturations requiring a venti mask to bring sats up.  Writer spoke with Ebony Harper, who felt that any procedure by GI would not be beneficial. Writer then spoke with attending physician Dr. Sherryll Harper and a meeting with Ebony Harper took place. At this time Ebony Harper has chosen to pursue comfort, including discontinuing IV antibiotics, IV fluids and labs, and have patient transition to nasal cannula for oxygen delivery from the venti mask. Plan is for patient to transfer to the hospice home for end of life care tomorrow 8/25 via EMS with portable DNR in place. Consents signed. Writer updated staff RN Ebony Batten, CSW [place] and [employer]. Updated information faxed to referral intake. Writer to  Follow up with report to the hospice home and EMS transport tomorrow. Thank you for the opportunity to serve Ebony Harper and her family. Ebony Barker RN, BSN, [facility] [place], hospital liaison [phone] c

## 2014-12-14 NOTE — Clinical Documentation Improvement (Signed)
Internal Medicine  Can the diagnosis of CKD be further specified?   CKD Stage I - GFR greater than or equal to 90  CKD Stage II - GFR 60-89  CKD Stage III - GFR 30-59  CKD Stage IV - GFR 15-29  CKD Stage V - GFR < 15  ESRD (End Stage Renal Disease)  Other condition  Unable to clinically determine   Supporting Information: : (risk factors, signs and symptoms, diagnostics, treatment) Record states CKD; BUN 28, Creatinine 1.63  Please exercise your independent, professional judgment when responding. A specific answer is not anticipated or expected.   Thank You, Carlyn Reichert [facility] Health Information Management [place]

## 2014-12-14 NOTE — Progress Notes (Signed)
Per Dr. Sherryll Burger and [facility] liaison plan is for patient to D/C to [place] tomorrow (12/15/14). Patient's friend and HPOA [facility] is in agreement with plan. Clinical Social Worker (CSW) will continue to follow and assist as needed.   Jetta Lout, LCSWA [phone]

## 2014-12-14 NOTE — Clinical Documentation Improvement (Signed)
  Internal Medicine  Can the diagnosis of acute renal failure be further specified?   Acute Renal Failure/Acute Kidney Injury  Acute Tubular Necrosis  Acute Renal Cortical Necrosis  Acute Renal Medullary Necrosis  Acute on Chronic Renal Failure  Chronic Renal Failure  Other  Clinically Undetermined  Document any associated diagnoses/conditions.   Supporting Information: Current BUN 28 and creatinine 1.63; GFR 38.   Please exercise your independent, professional judgment when responding. A specific answer is not anticipated or expected.   Thank You,  Carlyn Reichert [facility] Health Information Management [place]

## 2014-12-14 NOTE — Progress Notes (Signed)
Received call from Kathie Rhodes family friend called to see how patient was doing. Password was provided.

## 2014-12-14 NOTE — Progress Notes (Signed)
Met with patient's POA again mid-afternoon with Hospice Liaison to discuss goals of care.  We've all agreed to make patient comfort care only at this time and likely transfer to Hospice home tomorrow morning.  I've updated GI Dr Gustavo Lah and CM/SW about same.  Time spent:35 mins

## 2014-12-14 NOTE — Progress Notes (Signed)
Present in the room with patient when patient complained of not being able to breathe.  Oxygen saturation level 79% on 3 liters.  Suctioned orally and got copious amounts of thick mucus.  Increased oxygen level to 6 liters.  Patient oxygen levels rose to 85% on 6L.  Called respiratory and they did a naso suction and got large volumes of thick mucus out from the lungs via both nares.  Oxygen levels temporarily improved.  Dr. Sherryll Burger notified and was on unit and approved order for nasosuction.  Respiratory Therapist placed patient on ventimask since patient is open mouth breathing.

## 2014-12-14 NOTE — Progress Notes (Signed)
[facility] [place] [facility]   PATIENT NAME: Piccola Arico    MR#:  [id number]  DATE OF BIRTH:  [birth year]  SUBJECTIVE:  CHIEF COMPLAINT:   Chief Complaint  Patient presents with  . Aspiration  more awake and alert, got hypoxic with some thick mucus aspirations  REVIEW OF SYSTEMS:  Review of Systems  Constitutional: Negative for fever, weight loss, malaise/fatigue and diaphoresis.  HENT: Negative for ear discharge, ear pain, hearing loss, nosebleeds, sore throat and tinnitus.   Eyes: Negative for blurred vision and pain.  Respiratory: Negative for cough, hemoptysis, shortness of breath and wheezing.   Cardiovascular: Negative for chest pain, palpitations, orthopnea and leg swelling.  Gastrointestinal: Negative for heartburn, nausea, vomiting, abdominal pain, diarrhea, constipation and blood in stool.  Genitourinary: Negative for dysuria, urgency and frequency.  Musculoskeletal: Negative for myalgias and back pain.  Skin: Negative for itching and rash.  Neurological: Positive for weakness. Negative for dizziness, tingling, tremors, focal weakness, seizures and headaches.  Psychiatric/Behavioral: Negative for depression. The patient is not nervous/anxious.    DRUG ALLERGIES:   Allergies  Allergen Reactions  . Strawberry    VITALS:  Blood pressure 156/60, pulse 76, temperature 98.8 F (37.1 C), temperature source Oral, resp. rate 20, weight 71.215 kg (157 lb), SpO2 96 %. PHYSICAL EXAMINATION:  Physical Exam  Constitutional: She appears lethargic. She appears unhealthy.  HENT:  Head: Normocephalic and atraumatic.  Eyes: Conjunctivae and EOM are normal. Pupils are equal, round, and reactive to light.  Neck: Normal range of motion. Neck supple. No tracheal deviation present. No thyromegaly present.  Cardiovascular: Normal rate, regular rhythm and normal heart sounds.   Pulmonary/Chest: Effort normal. No respiratory distress. She has no wheezes. She  has rhonchi. She exhibits no tenderness.  Abdominal: Soft. Bowel sounds are normal. She exhibits no distension. There is no tenderness.  Musculoskeletal: Normal range of motion.  Neurological: She appears lethargic. She displays weakness. No cranial nerve deficit.  lethargic  Skin: Skin is warm and dry. No rash noted.  Psychiatric: Mood and affect normal.   LABORATORY PANEL:   CBC  Recent Labs Lab 12/14/14 0651  WBC 12.5*  HGB 11.1*  HCT 35.1  PLT 207   ------------------------------------------------------------------------------------------------------------------ Chemistries   Recent Labs Lab 12/11/14 1610  12/14/14 0651  NA 141  < > 146*  K 3.9  < > 3.5  CL 104  < > 114*  CO2 27  < > 24  GLUCOSE 112*  < > 85  BUN 28*  < > 20  CREATININE 1.63*  < > 1.14*  CALCIUM 9.2  < > 7.9*  AST 21  --   --   ALT 12*  --   --   ALKPHOS 75  --   --   BILITOT 0.2*  --   --   < > = values in this interval not displayed. RADIOLOGY:  Dg Chest 2 View  12/14/2014   CLINICAL DATA:  Pneumonia, leukocytosis, esophageal phase dysphasia  EXAM: CHEST  2 VIEW  COMPARISON:  12/13/2014  FINDINGS: Right middle lobe opacity, overlying the heart on the lateral view, suspicious for pneumonia.  Possible small right pleural effusion.  No pneumothorax.  Cardiomegaly.  Residual contrast the mid/ distal esophagus, suggesting esophageal dysmotility. Distal esophageal stricture is not excluded.  Degenerative changes of the visualized thoracolumbar spine.  Old left proximal humeral fracture deformity.  IMPRESSION: Right middle lobe pneumonia.  Possible small right pleural effusion.  Residual contrast in  the mid/distal esophagus, suggesting esophageal dysmotility. Distal esophageal stricture is not excluded.  Old left proximal humeral fracture deformity.   Electronically Signed   By: Charline Bills M.D.   On: 12/14/2014 09:50   Dg Chest 2 View  12/13/2014   CLINICAL DATA:  Chest pain.  EXAM: CHEST  2 VIEW   COMPARISON:  12/11/2014.  10/18/2014.  FINDINGS: Mediastinum and hilar structures normal. Cardiomegaly with normal pulmonary vascularity. Progressive right base atelectasis and/or infiltrate. Progressive small right pleural effusion. Left base subsegmental atelectasis. Old posttraumatic changes noted of the left proximal humerus. Diffuse osteopenia .  IMPRESSION: 1. Progressive mild atelectasis and/or infiltrate right lower lobe with progressive small right pleural effusion. Left base subsegmental atelectasis. 2. Cardiomegaly.  No evidence of pulmonary venous congestion.   Electronically Signed   By: Maisie Fus  Register   On: 12/13/2014 11:59   ASSESSMENT AND PLAN:  Gwenetta Devos is a 81 y.o. female with a known history of coronary artery disease, severe dementia, hypothyroidism hypertension presents from [facility] secondary to an episode of aspiration resulting in hypoxia and difficulty breathing.  #1 Aspiration pneumonia-continue oxygen support. -Keep nothing by mouth  speech therapy input appreciated - MBS shows esophageal stricture per ST. D/w Dr Marva Panda who will have discussion with family for possible dilation if POA agrees. Likely this is just for palliation so as she can have plaeasure feeding. - Continue Zosyn - Strict Aspiration precautions. - intermittent deep suctioning as tends to aspirate very easily and gets hypoxic.  #2 Severe dementia-seems to be at baseline. -Does have significant behavioral disturbances and agitation episodes. But Easily oversedated with new medications and sedatives. -Continue risperidone 3 times a day and also low-dose Ativan when necessary which are her home medications.  #3 hypertension-continue Norvasc  #4 hyperlipidemia-on statin  #5 Esophageal stricture: Per Santina Evans from speech, GI considering dilation after d/w family.  #6 Acute on chronic renal failure- continue gentle hydration and recheck.  Had talk with her POA (RITA)  again this am, plan is still to get her back to her facility [facility]) - hopefully with Hospice if she qualifies. D/w Hospice Liaison.  Also discussed with Dr Marva Panda.  All the records are reviewed and case discussed with Care Management/Social Worker. Management plans discussed with the patient, power of attorney and they are in agreement.  CODE STATUS: DO NOT RESUSCITATE  TOTAL TIME (Critical Care) TAKING CARE OF THIS PATIENT: 35 minutes.   She remains at high risk for cardio-resp failure and death, likely from recurrent aspirations.  More than 50% of the time was spent in counseling/coordination of care: YES (D/w POA)  POSSIBLE D/C IN 1-2 DAYS, DEPENDING ON CLINICAL CONDITION.   [facility], Anneta Rounds M.D on 12/14/2014 at 11:25 AM  Between 7am to 6pm - Pager - [phone]  After 6pm go to [email or web address] - password [email or web address]  [facility]  Office  [phone]  CC:  Primary care physician; Pcp Not In System

## 2014-12-14 NOTE — Progress Notes (Signed)
Post nasosuction

## 2014-12-15 MED ORDER — MORPHINE SULFATE (CONCENTRATE) 10 MG/0.5ML PO SOLN: 10.0000 mg | ORAL | Status: AC | PRN

## 2014-12-15 NOTE — Progress Notes (Signed)
Speech Therapy Note: reviewed chart notes noting pt's disposition. Pt is d/t d/c to Hospice Home today. ST will be available for any further education and information as needed. NSG updated; rec'd oral care for hygiene and comfort.

## 2014-12-15 NOTE — Progress Notes (Addendum)
Patient is transferring to [facility] today. [facility] Liaison reported that she will be at [facility] around 11 am to meet with family. Clinical Education officer, museum (CSW) prepared D/C packet including DNR form. CSW met with patient's 2 friends Velva Harman ([place]) and Inez Catalina at bedside. Velva Harman and Inez Catalina are in agreement with patient being transferred to [facility] today. Patient is currently on 7 liters of oxygen per RN. CSW called EMS non-emergency transport and they reported that they could transport patient on 7 liters of oxygen. Per Velva Harman she will contact [facility] and make them aware of above. Please reconsult if future social work needs arise. CSW signing off.   Blima Rich, [place] [phone]

## 2014-12-15 NOTE — Discharge Summary (Signed)
[facility] [place] NAME: Ebony Harper    MR#:  [id number]  DATE OF BIRTH:  July 08, 1930  DATE OF ADMISSION:  12/11/2014 ADMITTING PHYSICIAN: Gladstone Lighter, MD  DATE OF DISCHARGE: No discharge date for patient encounter.  PRIMARY CARE PHYSICIAN: Pcp Not In System    ADMISSION DIAGNOSIS:  DNR (do not resuscitate) [Z66] CAP (community acquired pneumonia) [J18.9] Aspiration pneumonia, unspecified aspiration pneumonia type [J69.0] Acute respiratory failure with hypoxemia [J96.01]  DISCHARGE DIAGNOSIS:  Active Problems:   Aspiration pneumonia Acute renal failure due to ATN CKD stage III Severe dementia Esophageal stricture SECONDARY DIAGNOSIS:   Past Medical History  Diagnosis Date  . Dementia   . MI (myocardial infarction)   . Renal disorder   . Thyroid disease   . Hyperlipidemia   . Hypertension    HOSPITAL COURSE:  81 y.o. female with a known history of coronary artery disease, severe dementia, hypothyroidism hypertension admitted from [facility] secondary to an episode of aspiration resulting in hypoxia and difficulty breathing.  Please see Dr. Michelene Heady dictated history and physical for further details.  She was noted to have aspiration pneumonia.  She was started on broad-spectrum IV antibiotics.  Considering concern for aspiration.  Speech therapy consultation was obtained who recommended formal modified barium swallow study which showed esophageal stricture.  GI consultation was obtained for not keen to perform endoscopy.  Considering her very high risk for complication, decision was made to meet with family.  I met with patient's POA Velva Harman was in agreement to consider hospice and keep her comfortable.  I along with hospice liaison had a meeting and long discussion with patient's POA again yesterday and decision was made to transfer patient to hospice home where she is being discharged in fair  condition.  She remains at very high risk for recurrent aspiration pneumonias and cardiorespiratory failure and likely demise.  While in the hospital.  She was noted to have acute on chronic kidney disease stage III, likely due to ATN. DISCHARGE CONDITIONS:  Stable CONSULTS OBTAINED:  Treatment Team:  Lollie Sails, MD DRUG ALLERGIES:   Allergies  Allergen Reactions  . Strawberry    DISCHARGE MEDICATIONS:   Current Discharge Medication List    START taking these medications   Details  Morphine Sulfate (MORPHINE CONCENTRATE) 10 MG/0.5ML SOLN concentrated solution Place 0.5 mLs (10 mg total) under the tongue every 2 (two) hours as needed for severe pain, anxiety or shortness of breath. Qty: 50 mL, Refills: 0      STOP taking these medications     acetaminophen (TYLENOL) 500 MG tablet      amLODipine (NORVASC) 10 MG tablet      aspirin 81 MG chewable tablet      atorvastatin (LIPITOR) 40 MG tablet      benzonatate (TESSALON) 100 MG capsule      Calcium Carbonate-Vitamin D (CALCIUM 600-D) 600-400 MG-UNIT per tablet      diclofenac sodium (VOLTAREN) 1 % GEL      diphenhydrAMINE (BENADRYL) 25 mg capsule      docusate sodium (COLACE) 100 MG capsule      ferrous sulfate 325 (65 FE) MG tablet      LORazepam (ATIVAN) 0.5 MG tablet      nitroGLYCERIN (NITROSTAT) 0.4 MG SL tablet      risperiDONE (RISPERDAL) 1 MG tablet      senna-docusate (SENOKOT-S) 8.6-50 MG per tablet  sertraline (ZOLOFT) 25 MG tablet      traMADol (ULTRAM) 50 MG tablet      traMADol (ULTRAM) 50 MG tablet        DISCHARGE INSTRUCTIONS:   DIET:  Pleasure feeling whatsoever She is able to enjoy DISCHARGE CONDITION:  Critical ACTIVITY:  Activity as tolerated OXYGEN:  Home Oxygen: Yes.    Oxygen Delivery: 2 liters/min via Patient connected to nasal cannula oxygen as needed for comfort care only DISCHARGE LOCATION:  Hospice home   If you experience worsening of your admission  symptoms, develop shortness of breath, life threatening emergency, suicidal or homicidal thoughts you must seek medical attention immediately by calling 911 or calling your MD immediately  if symptoms less severe.  You Must read complete instructions/literature along with all the possible adverse reactions/side effects for all the Medicines you take and that have been prescribed to you. Take any new Medicines after you have completely understood and accpet all the possible adverse reactions/side effects.   Please note  You were cared for by a hospitalist during your hospital stay. If you have any questions about your discharge medications or the care you received while you were in the hospital after you are discharged, you can call the unit and asked to speak with the hospitalist on call if the hospitalist that took care of you is not available. Once you are discharged, your primary care physician will handle any further medical issues. Please note that NO REFILLS for any discharge medications will be authorized once you are discharged, as it is imperative that you return to your primary care physician (or establish a relationship with a primary care physician if you do not have one) for your aftercare needs so that they can reassess your need for medications and monitor your lab values.    On the day of Discharge: VITAL SIGNS:  Blood pressure 153/64, pulse 63, temperature 99.4 F (37.4 C), temperature source Oral, resp. rate 18, weight 71.215 kg (157 lb), SpO2 92 %. PHYSICAL EXAMINATION:  GENERAL:  81 y.o.-year-old patient lying in the bed in acute distress.  Constitutional: She appears lethargic. She appears unhealthy.  HENT:  Head: Normocephalic and atraumatic.  Eyes: Conjunctivae and EOM are normal. Pupils are equal, round, and reactive to light.  Neck: Normal range of motion. Neck supple. No tracheal deviation present. No thyromegaly present.  Cardiovascular: Normal rate, regular rhythm  and normal heart sounds.  Pulmonary/Chest: Effort normal. No respiratory distress. She has no wheezes. She has rhonchi. She exhibits no tenderness.  Abdominal: Soft. Bowel sounds are normal. She exhibits no distension. There is no tenderness.  Musculoskeletal: Normal range of motion.  Neurological: She appears lethargic. She displays weakness. No cranial nerve deficit.  lethargic  Skin: Skin is warm and dry. No rash noted.  Psychiatric: Mood and affect normal.  DATA REVIEW:   CBC  Recent Labs Lab 12/14/14 0651  WBC 12.5*  HGB 11.1*  HCT 35.1  PLT 207    Chemistries   Recent Labs Lab 12/11/14 1610  12/14/14 0651  NA 141  < > 146*  K 3.9  < > 3.5  CL 104  < > 114*  CO2 27  < > 24  GLUCOSE 112*  < > 85  BUN 28*  < > 20  CREATININE 1.63*  < > 1.14*  CALCIUM 9.2  < > 7.9*  AST 21  --   --   ALT 12*  --   --   ALKPHOS 75  --   --  BILITOT 0.2*  --   --   < > = values in this interval not displayed.  Cardiac Enzymes  Recent Labs Lab 12/11/14 1610  TROPONINI <0.03    Microbiology Results  Results for orders placed or performed during the hospital encounter of 12/11/14  Blood culture (routine x 2)     Status: None (Preliminary result)   Collection Time: 12/11/14  5:52 PM  Result Value Ref Range Status   Specimen Description BLOOD RIGHT ASSIST CONTROL  Final   Special Requests   Final    BOTTLES DRAWN AEROBIC AND ANAEROBIC  AER 3CC ANA 1CC   Culture NO GROWTH 4 DAYS  Final   Report Status PENDING  Incomplete  Blood culture (routine x 2)     Status: None (Preliminary result)   Collection Time: 12/11/14  6:04 PM  Result Value Ref Range Status   Specimen Description BLOOD LEFT HAND  Final   Special Requests BOTTLES DRAWN AEROBIC AND ANAEROBIC  1CC  Final   Culture NO GROWTH 4 DAYS  Final   Report Status PENDING  Incomplete  Culture, expectorated sputum-assessment     Status: None   Collection Time: 12/14/14 11:14 AM  Result Value Ref Range Status   Specimen  Description SPUTUM  Final   Special Requests NONE  Final   Sputum evaluation THIS SPECIMEN IS ACCEPTABLE FOR SPUTUM CULTURE  Final   Report Status 12/14/2014 FINAL  Final    RADIOLOGY:  Dg Chest 2 View  12/14/2014   CLINICAL DATA:  Pneumonia, leukocytosis, esophageal phase dysphasia  EXAM: CHEST  2 VIEW  COMPARISON:  12/13/2014  FINDINGS: Right middle lobe opacity, overlying the heart on the lateral view, suspicious for pneumonia.  Possible small right pleural effusion.  No pneumothorax.  Cardiomegaly.  Residual contrast the mid/ distal esophagus, suggesting esophageal dysmotility. Distal esophageal stricture is not excluded.  Degenerative changes of the visualized thoracolumbar spine.  Old left proximal humeral fracture deformity.  IMPRESSION: Right middle lobe pneumonia.  Possible small right pleural effusion.  Residual contrast in the mid/distal esophagus, suggesting esophageal dysmotility. Distal esophageal stricture is not excluded.  Old left proximal humeral fracture deformity.   Electronically Signed   By: Julian Hy M.D.   On: 12/14/2014 09:50   Dg Chest 2 View  12/13/2014   CLINICAL DATA:  Chest pain.  EXAM: CHEST  2 VIEW  COMPARISON:  12/11/2014.  10/18/2014.  FINDINGS: Mediastinum and hilar structures normal. Cardiomegaly with normal pulmonary vascularity. Progressive right base atelectasis and/or infiltrate. Progressive small right pleural effusion. Left base subsegmental atelectasis. Old posttraumatic changes noted of the left proximal humerus. Diffuse osteopenia .  IMPRESSION: 1. Progressive mild atelectasis and/or infiltrate right lower lobe with progressive small right pleural effusion. Left base subsegmental atelectasis. 2. Cardiomegaly.  No evidence of pulmonary venous congestion.   Electronically Signed   By: Marcello Moores  Register   On: 12/13/2014 11:59   Management plans discussed with the patient, family and they are in agreement.  CODE STATUS: DO NOT RESUSCITATE, Comfort Care  only   TOTAL TIME TAKING CARE OF THIS PATIENT: 55  minutes.    [facility], Magnum Lunde M.D on 12/15/2014 at 10:37 AM  Between 7am to 6pm - Pager - [phone]  After 6pm go to [email or web address] - password [email or web address] [facility]  Office  [phone]  CC: Primary care physician; Pcp Not In System Lollie Sails, MD

## 2014-12-15 NOTE — Discharge Summary (Signed)
Ebony Harper from hospice home at bedside and has talked with family regarding discharge plan to hospice home. Ebony Harper has called report and has notified ems, pt given oral morphine d/t labored breathing and per family request, pt is now resting comfortably, IV on the LAC has been removed, pressure and dressing applied at site, no ss of bleeding noted.

## 2014-12-15 NOTE — Discharge Instructions (Signed)
Hospice °Hospice is a service that is designed to provide people who are terminally ill and their families with medical, spiritual, and psychological support. Its aim is to improve your quality of life by keeping you as alert and comfortable as possible. Hospice is performed by a team of health care professionals and volunteers who: °· Help keep you comfortable. Hospice can be provided in your home or in a homelike setting. The hospice staff works with your family and friends to help meet your needs. You will enjoy the support of loved ones by receiving much of your basic care from family and friends. °· Provide pain relief and manage your symptoms. The staff supply all necessary medicines and equipment. °· Provide companionship when you are alone. °· Allow you and your family to rest. They may do light housekeeping, prepare meals, and run errands. °· Provide counseling. They will make sure your emotional, spiritual, and social needs and those of your family are being met. °· Provide spiritual care. Spiritual care is individualized to meet your needs and your family's needs. It may involve helping you look at what death means to you, say goodbye, or perform a specific religious ceremony or ritual. °Hospice teams often include: °· A nurse. °· A doctor. °· Social workers. °· Religious leaders (such as a chaplain). °· Trained volunteers. °WHEN SHOULD HOSPICE CARE BEGIN? °Most people who use hospice are believed to have fewer than 6 months to live. Your family and health care providers can help you decide when hospice services should begin. If your condition improves, you may discontinue the program. °WHAT SHOULD I CONSIDER BEFORE SELECTING A PROGRAM? °Most hospice programs are run by nonprofit, independent organizations. Some are affiliated with hospitals, nursing homes, or home health care agencies. Hospice programs can take place in the home or at a hospice center, hospital, or skilled nursing facility. When choosing  a hospice program, ask the following questions: °· What services are available to me? °· What services are offered to my loved ones? °· How involved are my loved ones? °· How involved is my health care provider? °· Who makes up the hospice care team? How are they trained or screened? °· How will my pain and symptoms be managed? °· If my circumstances change, can the services be provided in a different setting, such as my home or in the hospital? °· Is the program reviewed and licensed by the state or certified in some other way? °WHERE CAN I LEARN MORE ABOUT HOSPICE? °You can learn about existing hospice programs in your area from your health care providers. You can also read more about hospice online. The websites of the following organizations contain helpful information: °· The National Hospice and Palliative Care Organization (NHPCO). °· The Hospice Association of America (HAA). °· The Hospice Education Institute. °· The American Cancer Society (ACS). °· Hospice Net. °Document Released: 07/26/2003 Document Revised: 04/13/2013 Document Reviewed: 02/16/2013 °ExitCare® Patient Information ©2015 [employer]. This information is not intended to replace advice given to you by your health care provider. Make sure you discuss any questions you have with your health care provider. ° °

## 2014-12-15 NOTE — Care Management Important Message (Signed)
Important Message  Patient Details  Name: Ebony Harper MRN: [id number] Date of Birth: [birth year]   Medicare Important Message Given:  Yes-third notification given    Verita Schneiders Allmond 12/15/2014, 9:38 AM

## 2014-12-15 NOTE — Progress Notes (Signed)
Follow up visit made on new hospice home referral. Patient with eyes closed, does rouse to voice. Oxygen in place at 7 liters. Friends Kathie Rhodes and Klahr at bedside. Consents and discharge summary faxed to referral intake. Signed portable DNR in lace on patient's chart. Report called to hospice home, EMS notified for pick up. Staff RN Isabelle Course, attending physician Dr. Sherryll Burger and CSW Fredric Mare all aware of plan for discharge to the hospice home for end of life care. Thank you for the opportunity to be involved in the care of Ms.Gowan. Dayna Barker, RN, BSN, [facility] [place], [facility] [phone] c

## 2014-12-16 LAB — CULTURE, BLOOD (ROUTINE X 2)
CULTURE: NO GROWTH
Culture: NO GROWTH

## 2014-12-20 LAB — CULTURE, RESPIRATORY W GRAM STAIN

## 2014-12-20 LAB — CULTURE, RESPIRATORY

## 2014-12-22 DEATH — deceased

## 2016-08-26 IMAGING — CR DG THORACIC SPINE 2V
1 series · 2 of 2 positions shown · non-contrast
Comparison: Two-view chest radiograph 10/03/2013

CLINICAL DATA: Unwitnessed fall of uncertain cause. Back pain.
Initial encounter.

EXAM:
THORACIC SPINE - 2 VIEW

[Series 1: dg thoracic spine 2 view · 0.14mm/px · 2 of 2 slices shown]
[im 1/2]
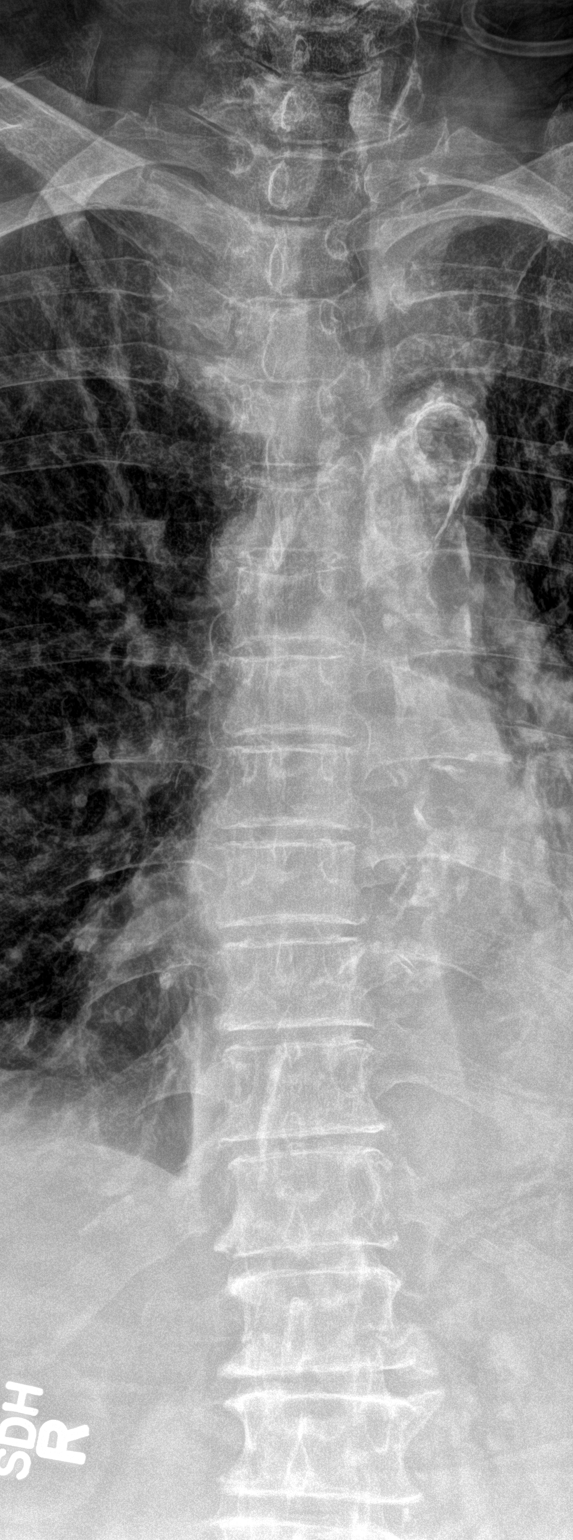
[im 2/2]
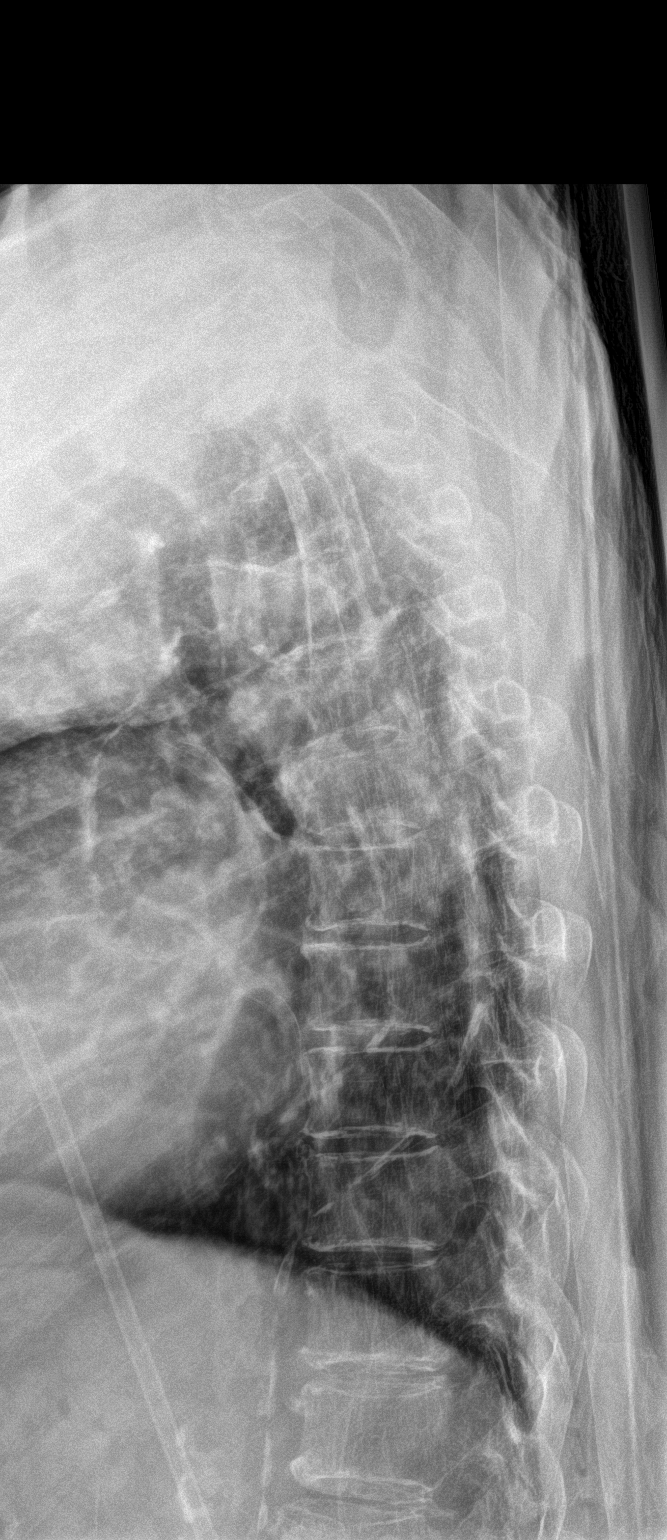

[2 of 2 positions shown; findings below may reference images not displayed]

FINDINGS: There are 12 pairs of ribs. Slight left convex curvature of the
upper lumbar spine is partially visualized. The upper thoracic spine
is suboptimally evaluated on the lateral radiograph due to
osteopenia and overlapping shoulder tissues. Allowing for this, no
thoracic spine listhesis or definite acute compression fracture is
identified. There is at most minimal anterior height loss of a mid
thoracic vertebral body which is unchanged. No destructive osseous
lesion is identified. Diffuse aortic calcification is noted.
IMPRESSION: No acute osseous abnormality identified in the thoracic spine.

## 2016-08-26 IMAGING — CT CT HEAD W/O CM
4 series · 17 of 30 positions shown, 18 images · non-contrast
Comparison: CT dated 07/02/2014

CLINICAL DATA: Fall and head injury

EXAM:
CT HEAD WITHOUT CONTRAST
TECHNIQUE: Contiguous axial images were obtained from the base of the skull
through the vertex without intravenous contrast.

[Series 2: soft tissue · axial · 0.41mm/px · z∈[+521,+566]mm · 2 of 28 slices shown]
[im 10/28  brain]
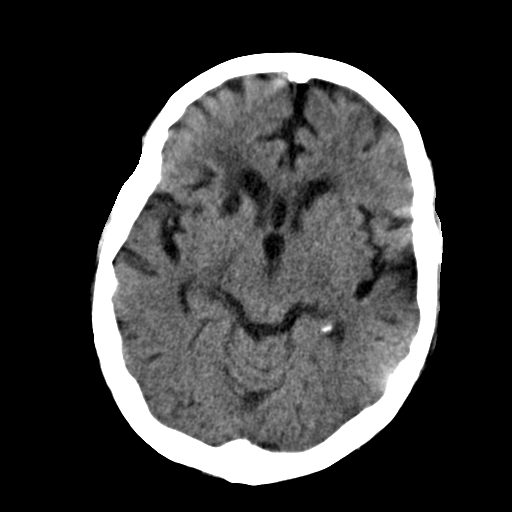
[im 19/28  brain]
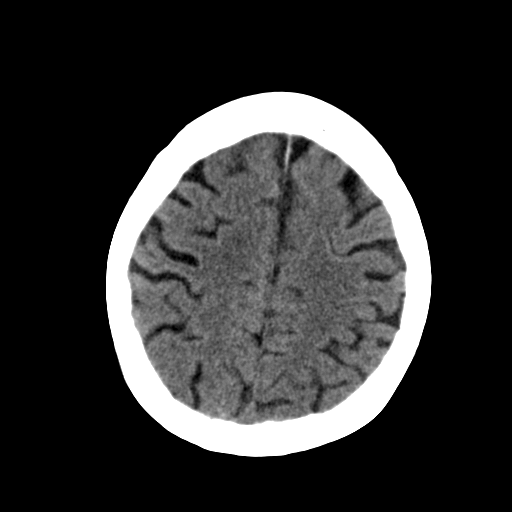

[Series 3: bone · axial · 0.41mm/px · z∈[+480,+552]mm · 5 of 80 slices shown]
[im 8/80  bone]
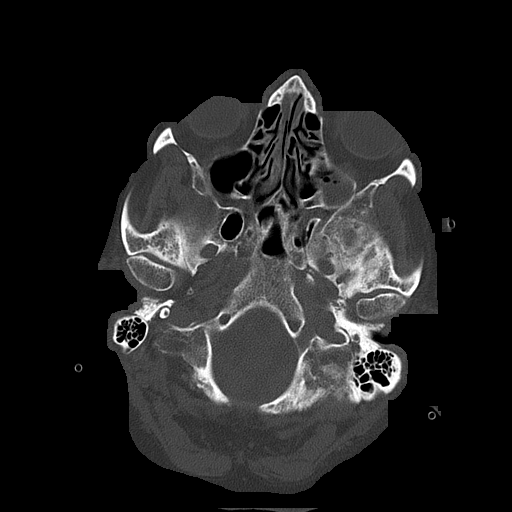
[im 15/80  bone]
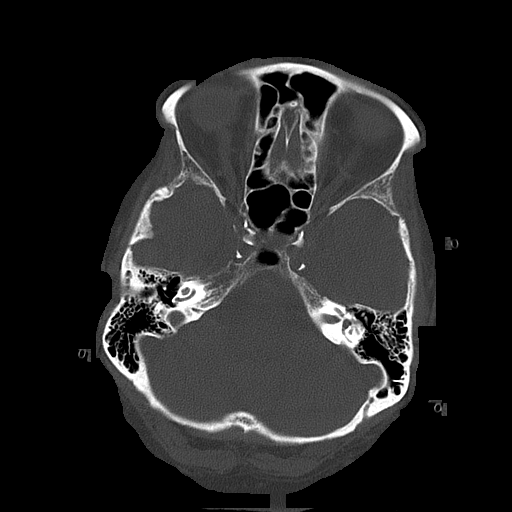
[im 29/80  bone]
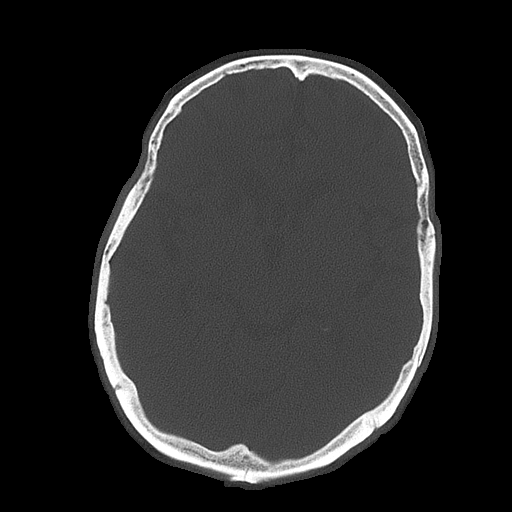
[im 36/80  bone]
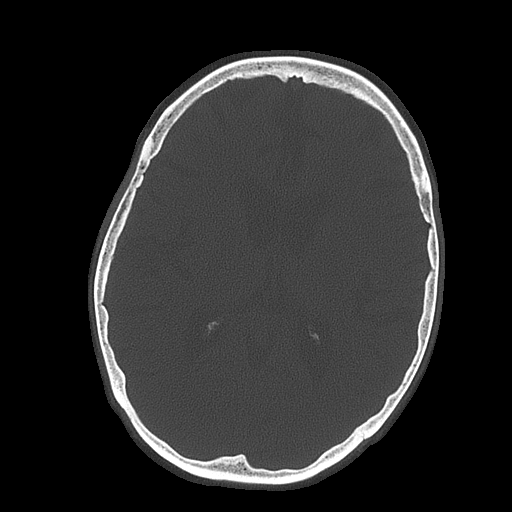
[im 44/80  bone]
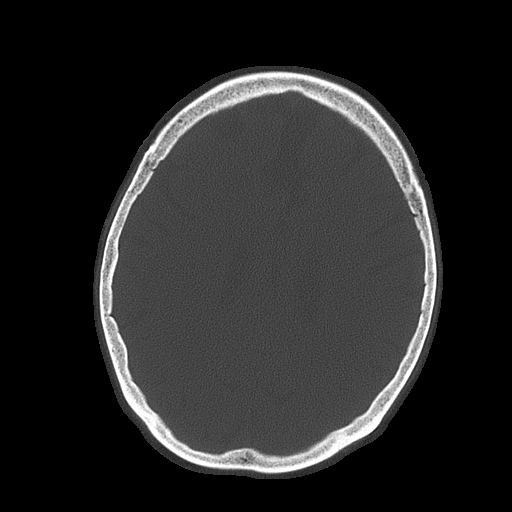

[Series 4: soft tissue recon · axial · 0.35mm/px · z∈[+526,+571]mm · 2 of 29 slices shown, 3 images]
[im 10/29  brain]
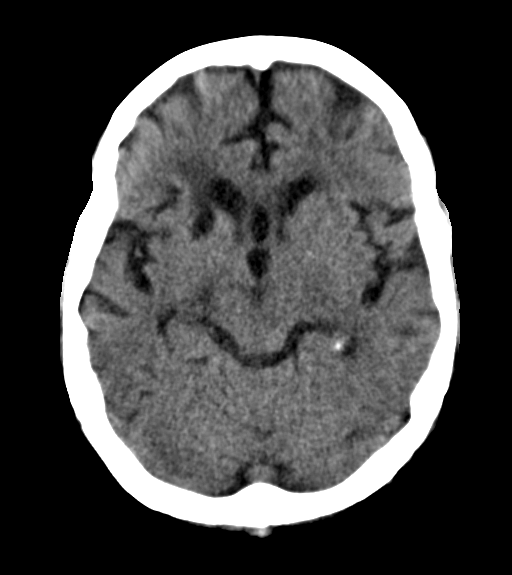
[im 10/29  bone]
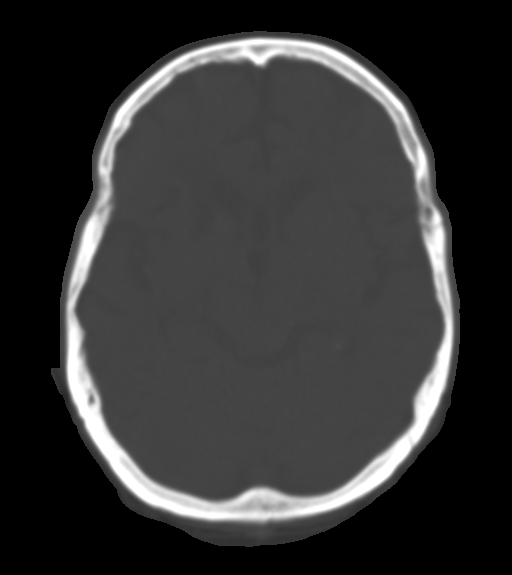
[im 19/29  brain]
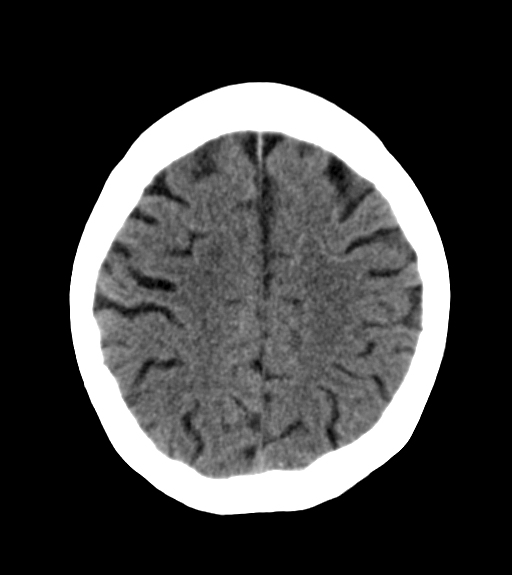

[Series 5: bone recon · axial · 0.36mm/px · z∈[+490,+598]mm · 8 of 70 slices shown]
[im 8/70  bone]
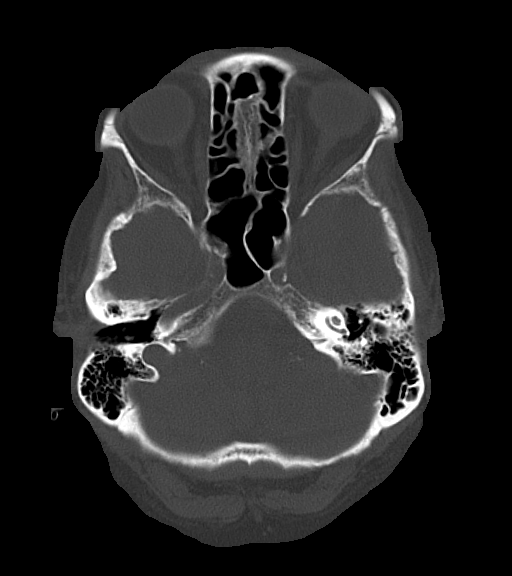
[im 16/70  bone]
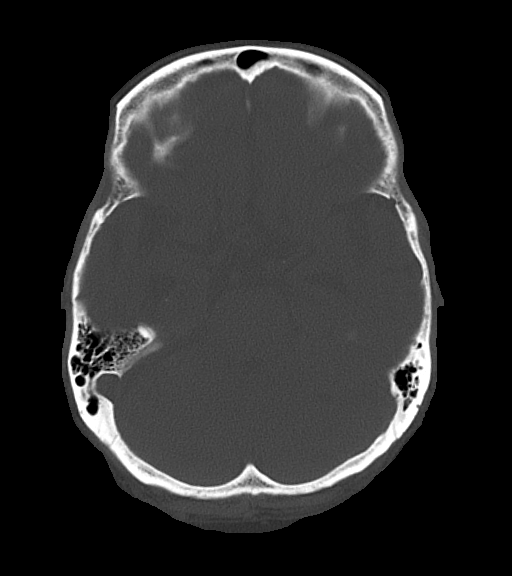
[im 24/70  bone]
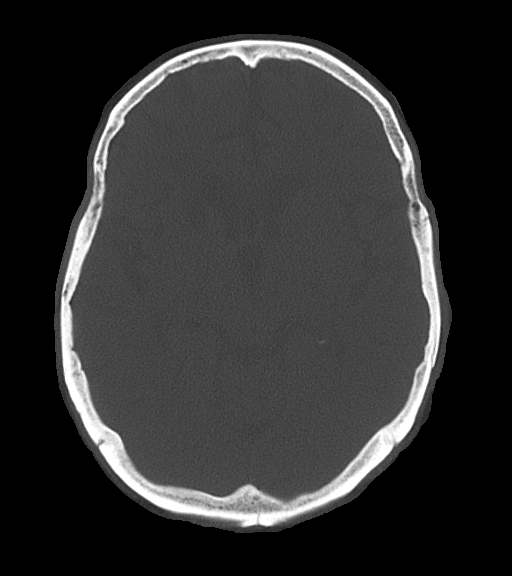
[im 31/70  bone]
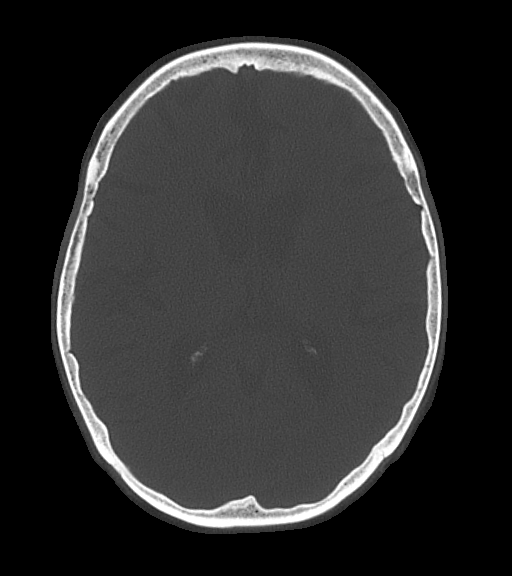
[im 39/70  bone]
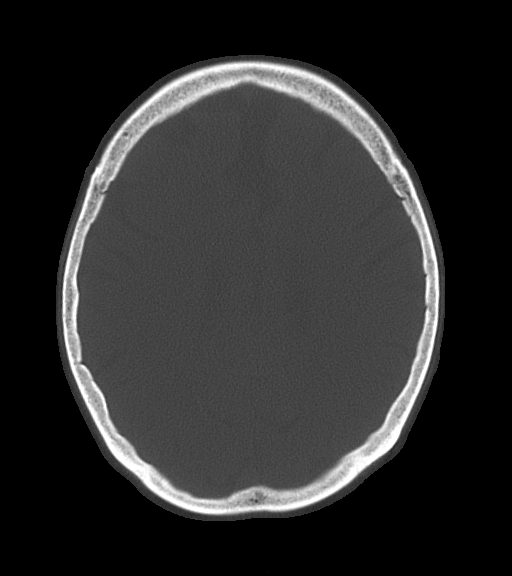
[im 47/70  bone]
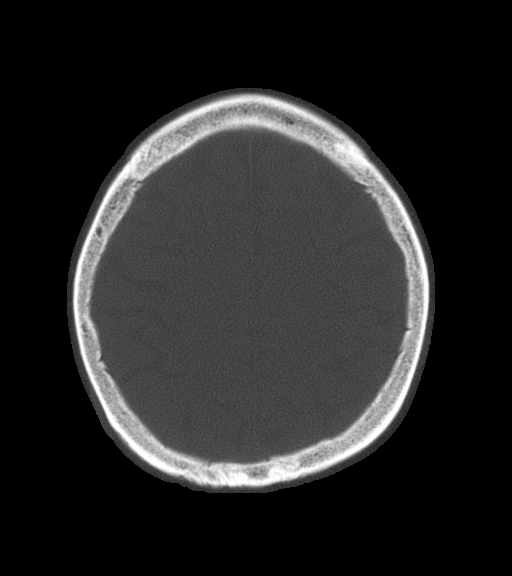
[im 54/70  bone]
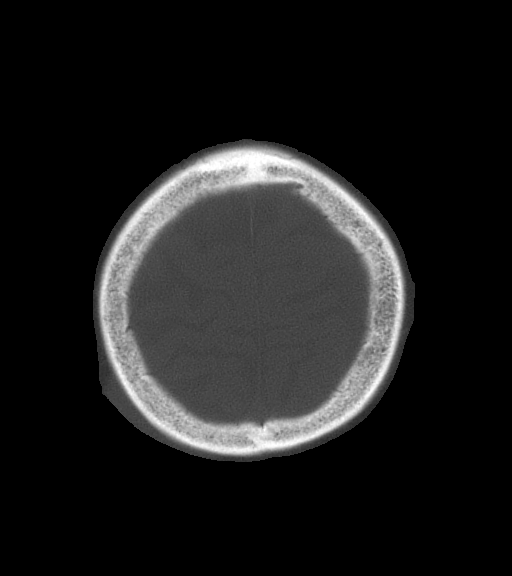
[im 62/70  bone]
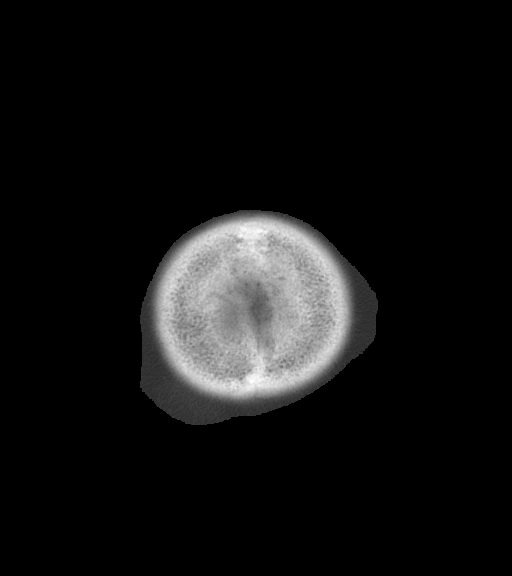

[17 of 30 positions shown; findings below may reference images not displayed]

FINDINGS: There is mild prominence of the ventricles and sulci compatible with
age-related atrophy. Periventricular and deep white matter
hypodensities represent chronic microvascular ischemic changes.
Stable right anterior internal capsule old lacunar infarct. Stable
subcentimeter left cerebellar hypodense focus again noted. There is
no intracranial hemorrhage. No mass effect or midline shift
identified.

There is partial opacification of the left maxillary sinus. The
remainder of the visualized paranasal sinuses and mastoid air cells
are well aerated. The calvarium is intact. Right posterior parietal
scalp swelling.
IMPRESSION: No acute intracranial hemorrhage.

Age-related atrophy and chronic microvascular ischemic disease.
Stable old right internal capsule lacunar infarct.

## 2016-08-26 IMAGING — CR DG SHOULDER 2+V*L*
1 series · 3 of 3 positions shown · non-contrast
Comparison: Chronic

CLINICAL DATA: Pain following fall

EXAM:
LEFT SHOULDER - 2+ VIEW

[Series 1: dg shoulder left · 0.14mm/px · 3 of 3 slices shown]
[im 1/3]
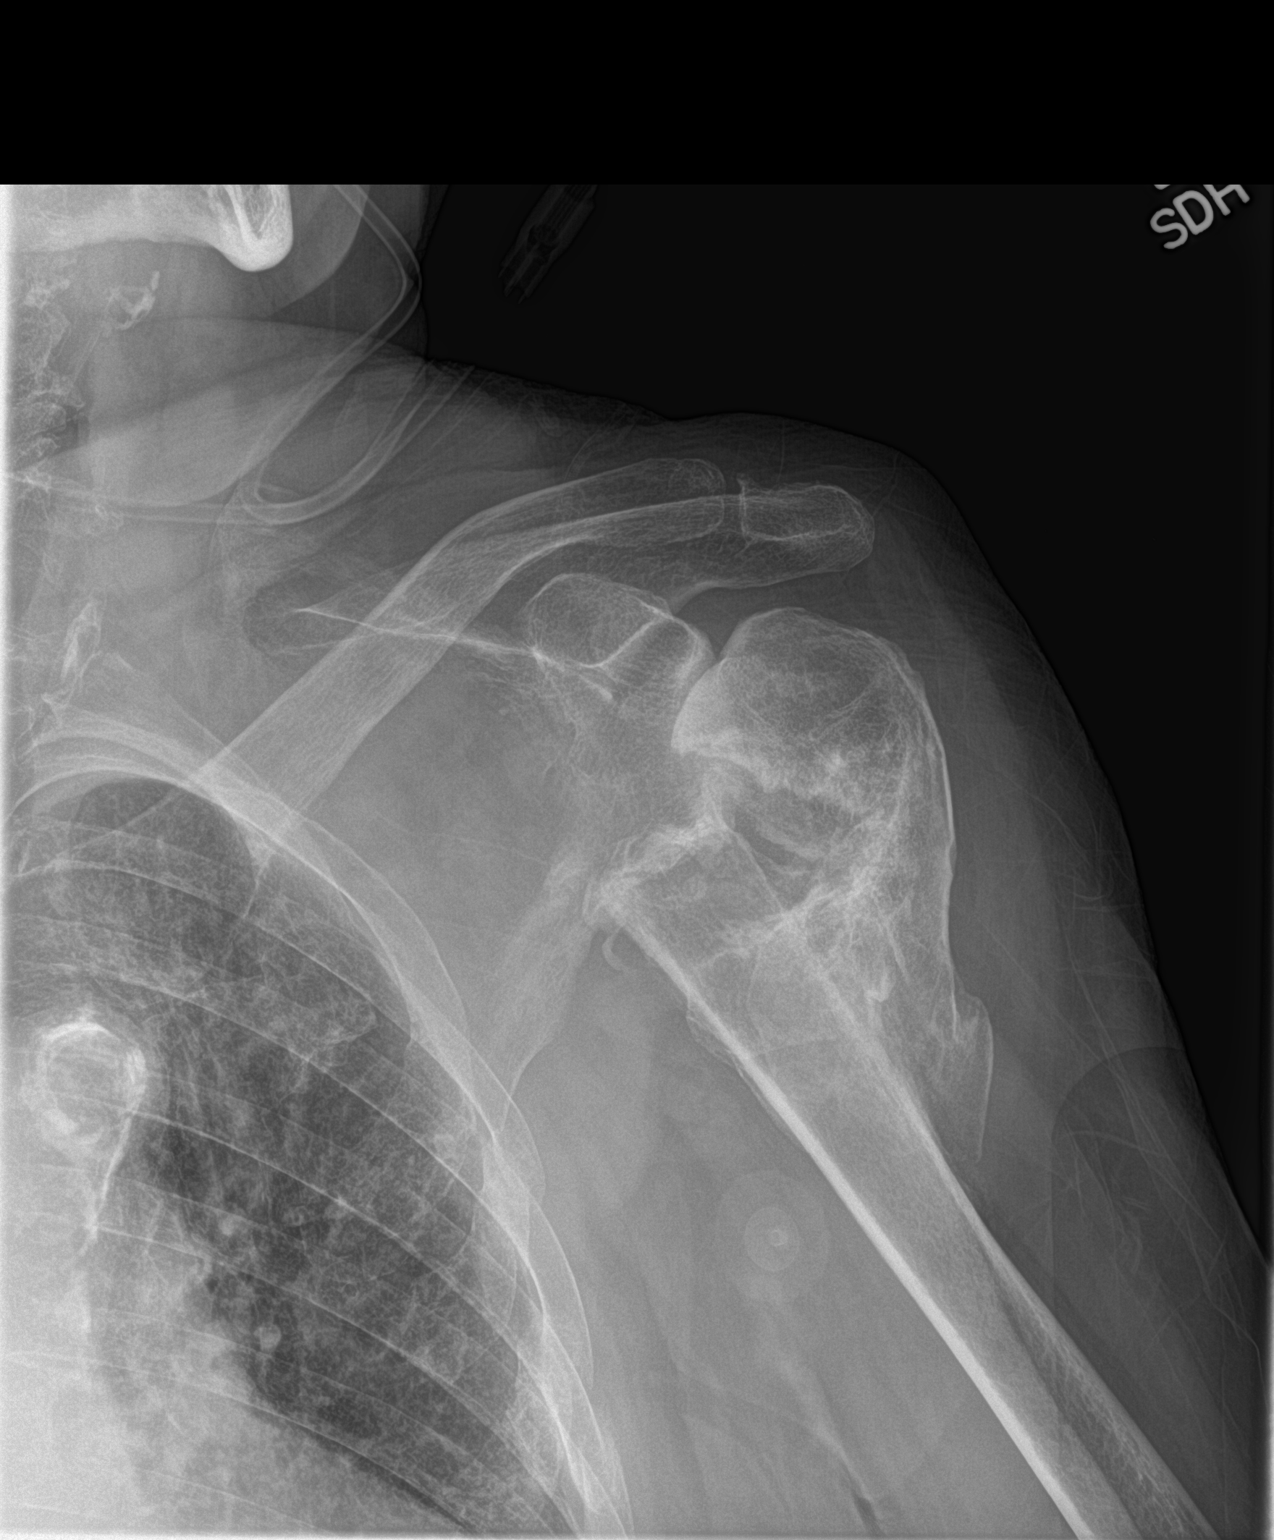
[im 2/3]
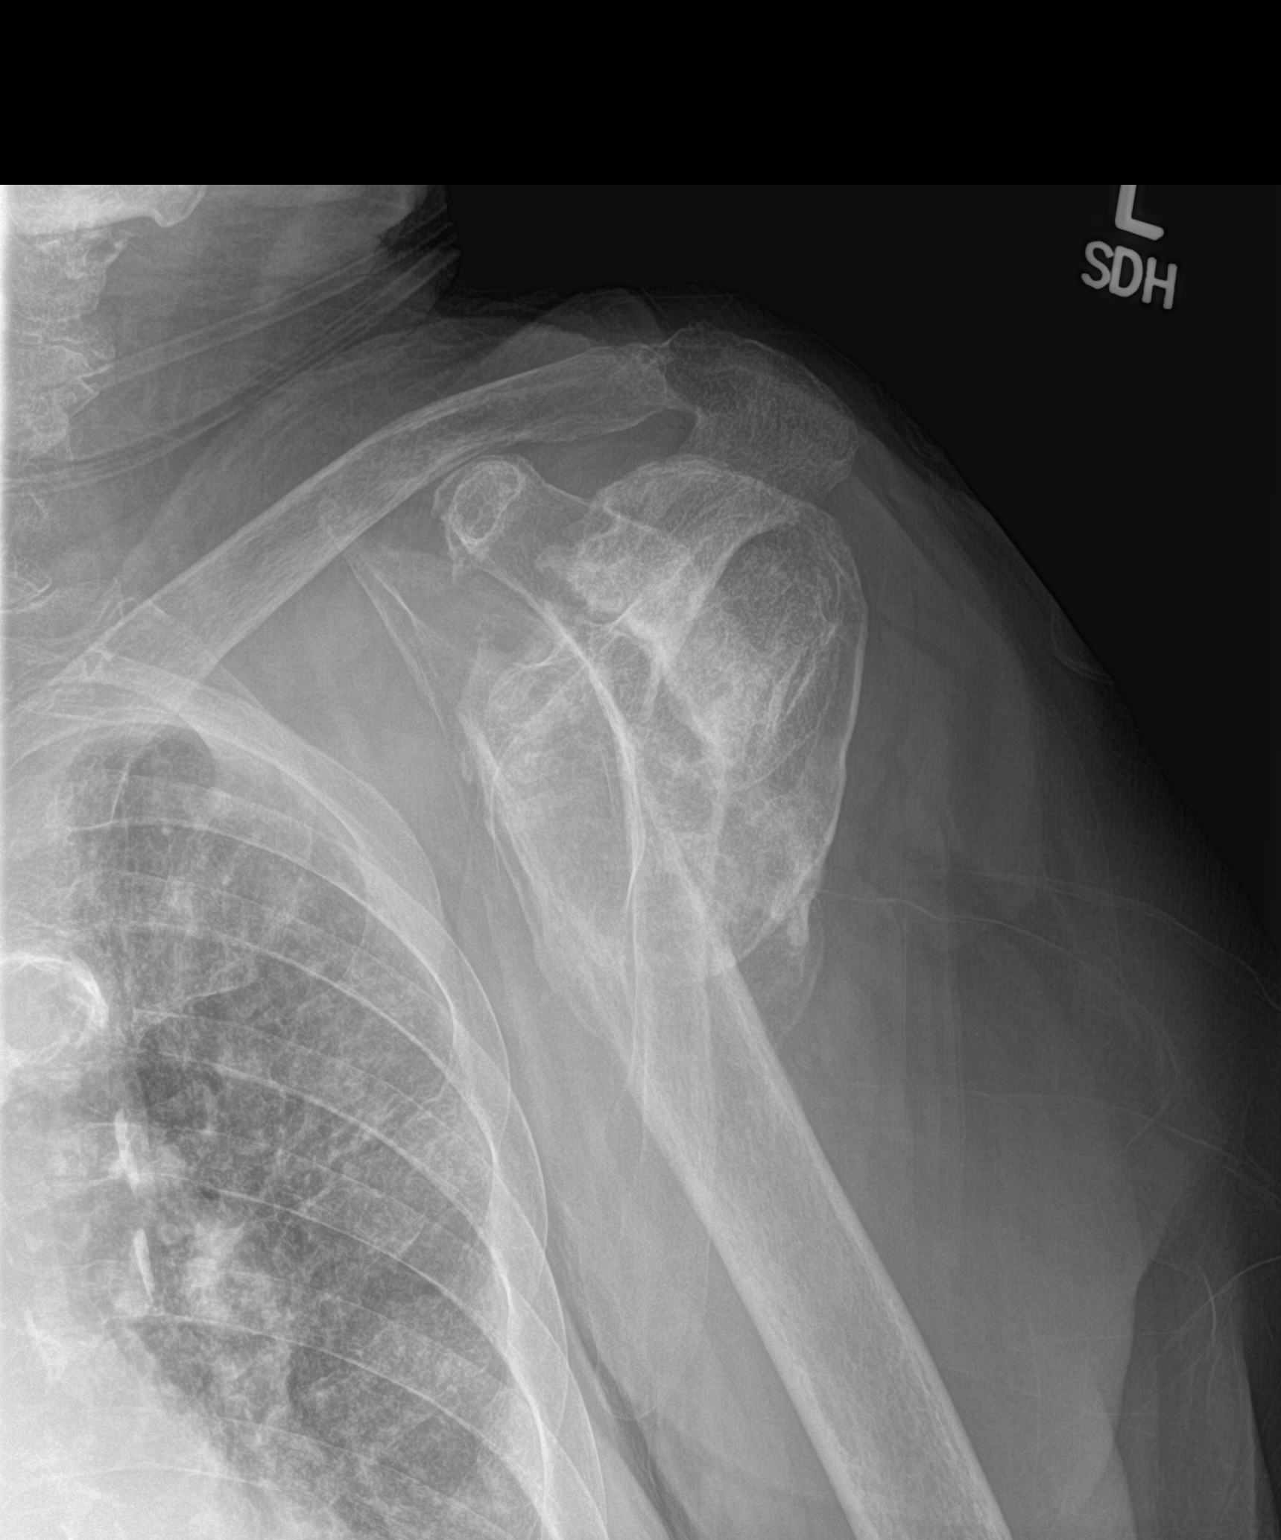
[im 3/3]
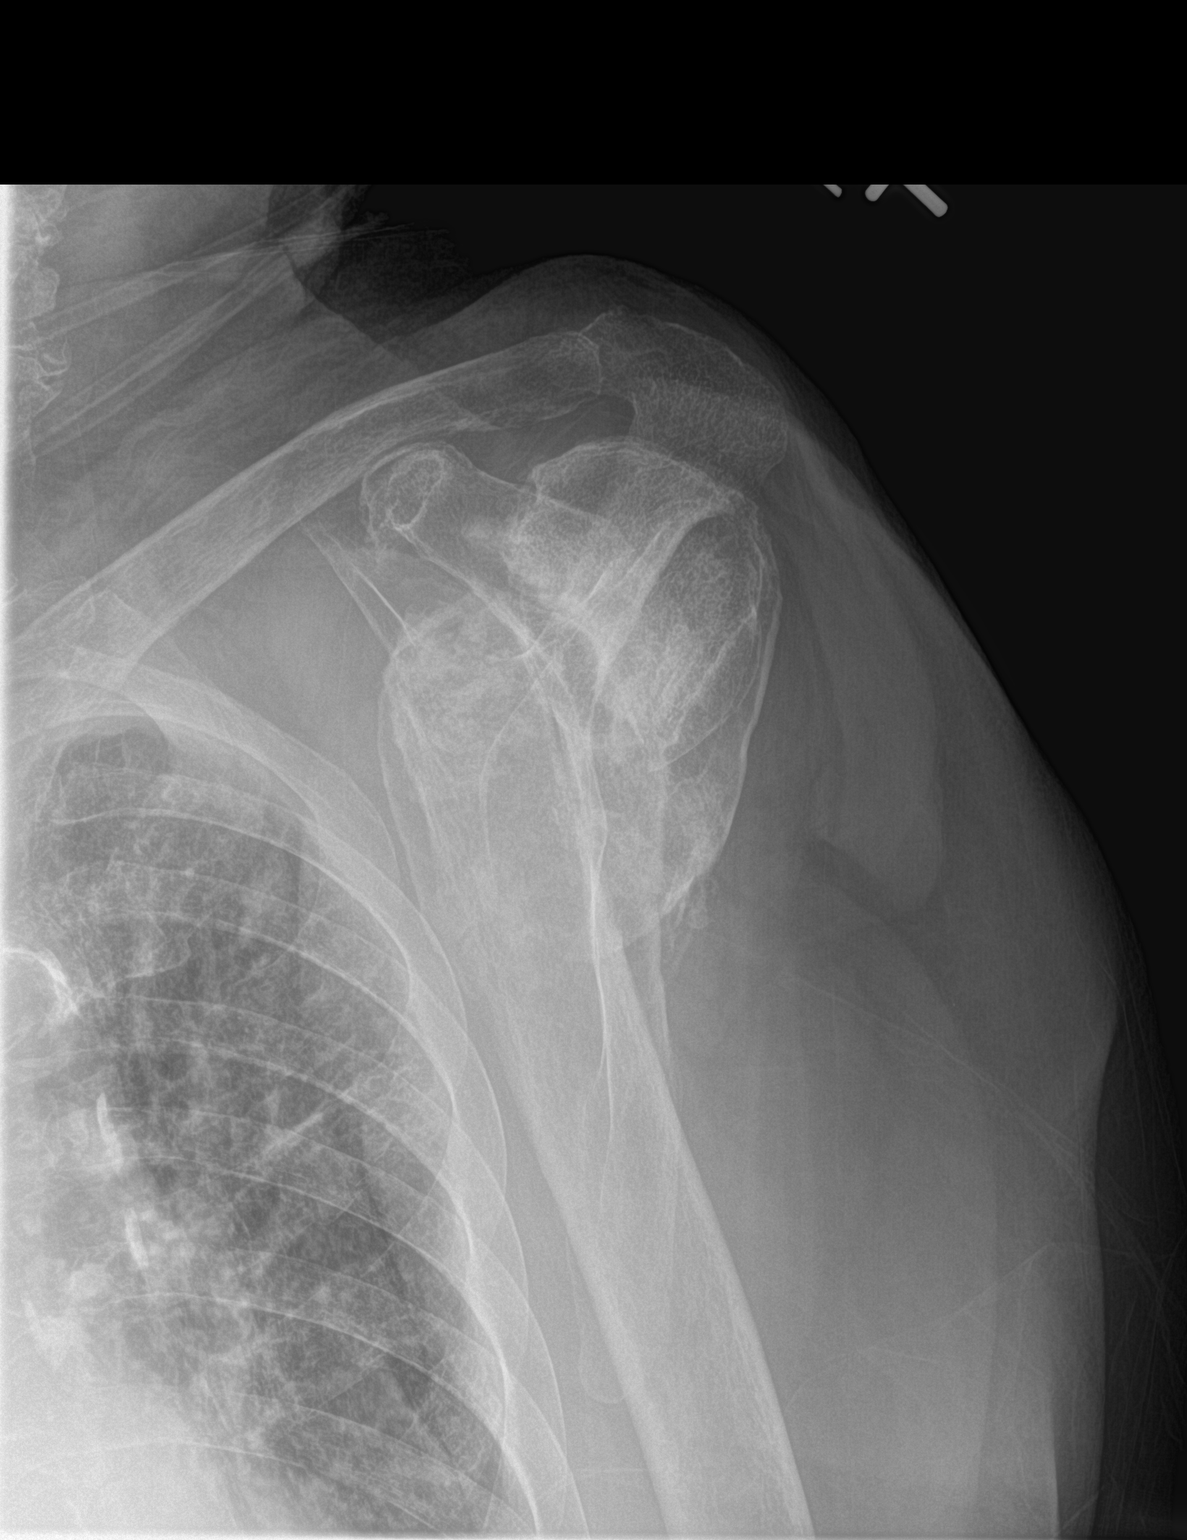

[3 of 3 positions shown; findings below may reference images not displayed]

FINDINGS: Frontal and Y scapular images were obtained. There is evidence of a
chronic displaced fracture of the proximal humeral metaphysis. The
humeral shaft is displaced medially with lateral angulation distally
with sclerosis along the fracture margins consistent with chronic
nonunion and displacement. No acute fracture is apparent. The
humeral head remains position within the shoulder joint but is
rotated, a stable finding. There is osteoarthritic change in the
glenohumeral joint.
IMPRESSION: Stable chronic nonunited fracture of the proximal humeral metaphysis
with displaced fracture fragments. No acute fracture. No frank
dislocation. Moderate osteoarthritic change.
